# Patient Record
Sex: Female | Born: 1970 | Race: White | Hispanic: No | Marital: Married | Smoking: Former smoker
Health system: Southern US, Community
[De-identification: ages and names within clinical notes are randomized; demographics above are authoritative.]

## PROBLEM LIST (undated history)

## (undated) ENCOUNTER — Inpatient Hospital Stay (HOSPITAL_COMMUNITY): Payer: Self-pay

## (undated) DIAGNOSIS — L309 Dermatitis, unspecified: Secondary | ICD-10-CM

## (undated) HISTORY — PX: CRYOTHERAPY: SHX1416

## (undated) HISTORY — PX: DILATION AND CURETTAGE OF UTERUS: SHX78

## (undated) HISTORY — DX: Dermatitis, unspecified: L30.9

---

## 2003-11-03 ENCOUNTER — Other Ambulatory Visit: Admission: RE | Admit: 2003-11-03 | Discharge: 2003-11-03 | Payer: Self-pay | Admitting: Gynecology

## 2006-12-21 ENCOUNTER — Encounter (INDEPENDENT_AMBULATORY_CARE_PROVIDER_SITE_OTHER): Payer: Self-pay | Admitting: Obstetrics & Gynecology

## 2006-12-21 ENCOUNTER — Ambulatory Visit: Payer: Self-pay | Admitting: Obstetrics & Gynecology

## 2007-01-08 ENCOUNTER — Ambulatory Visit (HOSPITAL_COMMUNITY): Admission: RE | Admit: 2007-01-08 | Discharge: 2007-01-08 | Payer: Self-pay | Admitting: Obstetrics and Gynecology

## 2007-02-01 ENCOUNTER — Ambulatory Visit: Payer: Self-pay | Admitting: Obstetrics and Gynecology

## 2007-11-04 IMAGING — US US PELVIS COMPLETE MODIFY
1 series · 13 of 25 positions shown · non-contrast
Comparison: none

CLINICAL DATA: Evaluate for fibroids versus polyps. Menorrhagic. LMP 12/17/06. 
TRANSABDOMINAL AND TRANSVAGINAL PELVIC ULTRASOUND:
TECHNIQUE: Multiple images of the uterus and adnexa were obtained using a transabdominal and endovaginal approaches.

[Series 1: us pelvis complete modify · 0.26mm/px · 54 acquisitions, 13 frames shown]
[im 1/54]
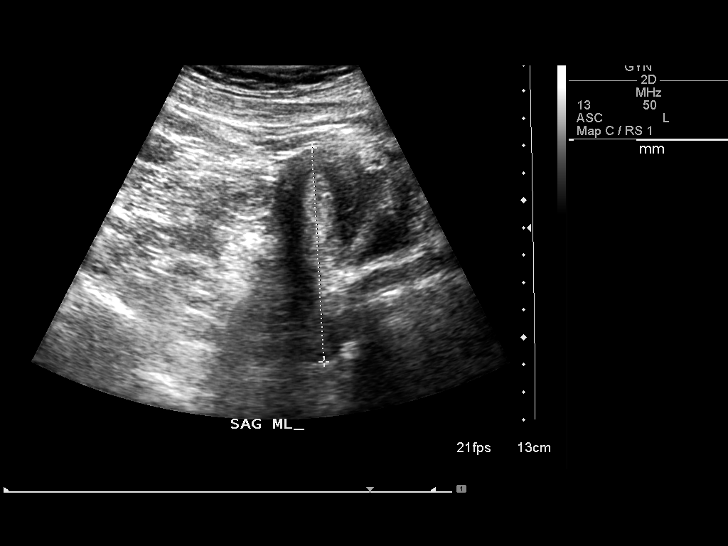
[im 5/54]
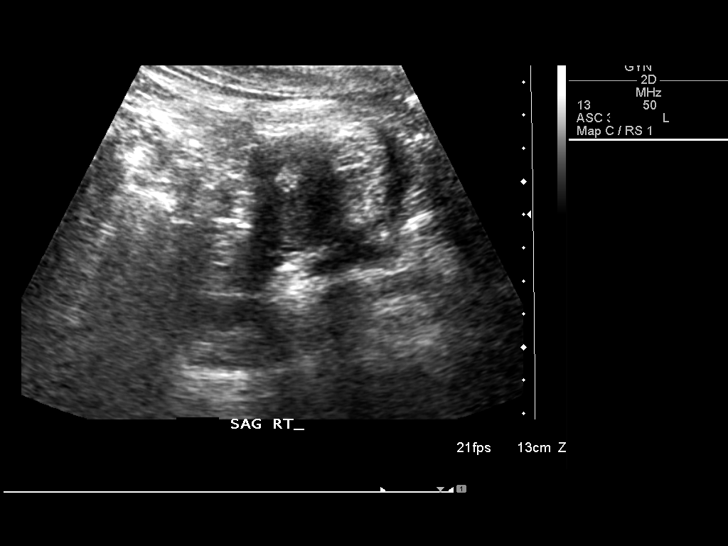
[im 9/54]
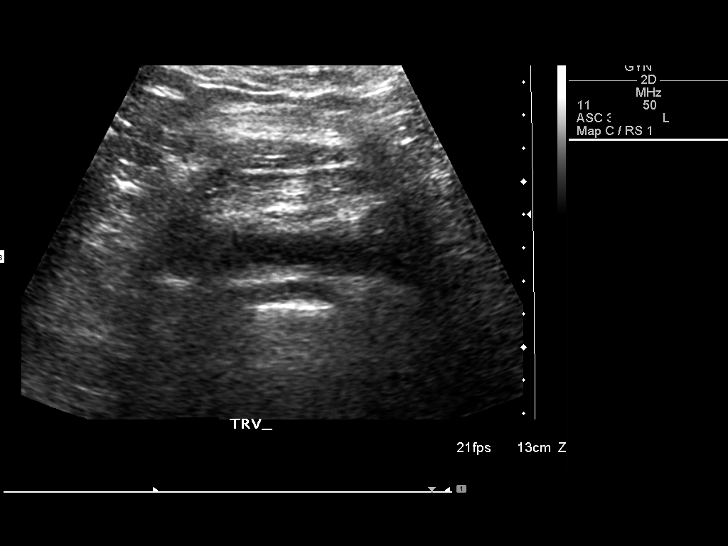
[im 14/54]
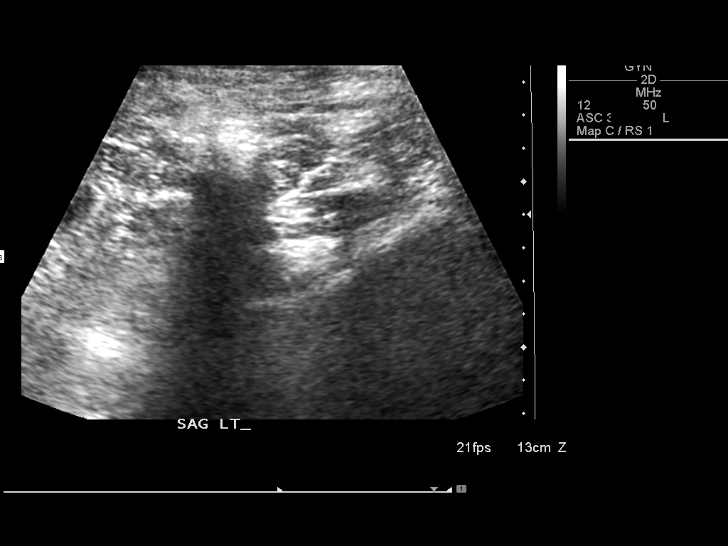
[im 18/54]
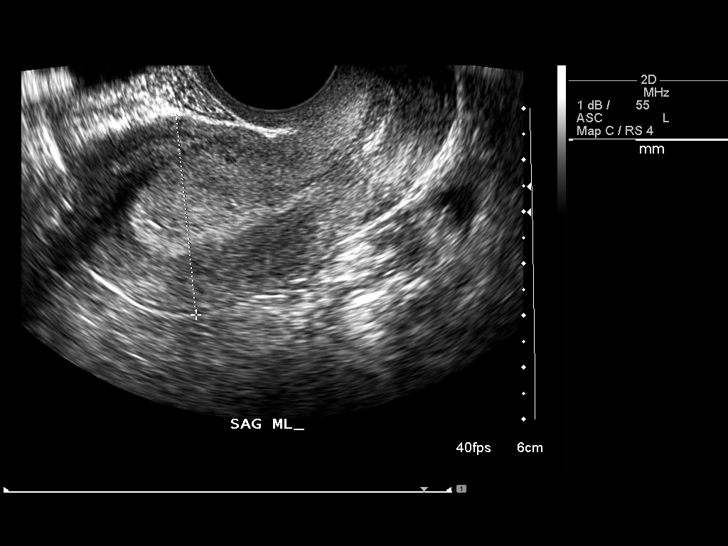
[im 23/54]
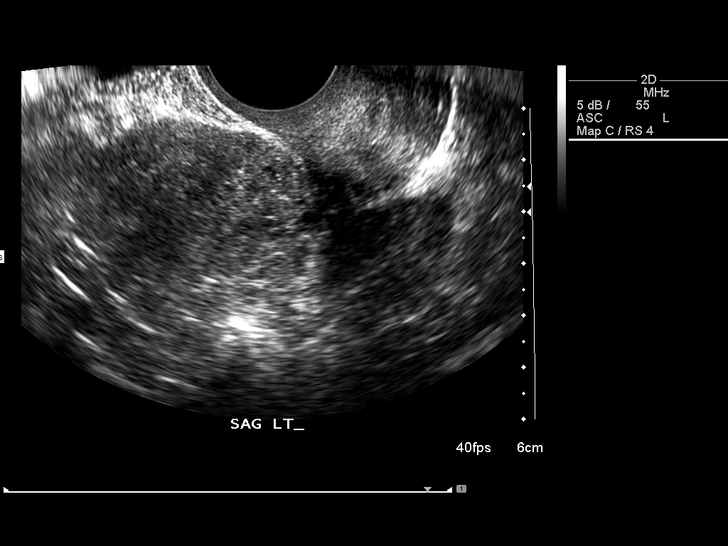
[im 27/54]
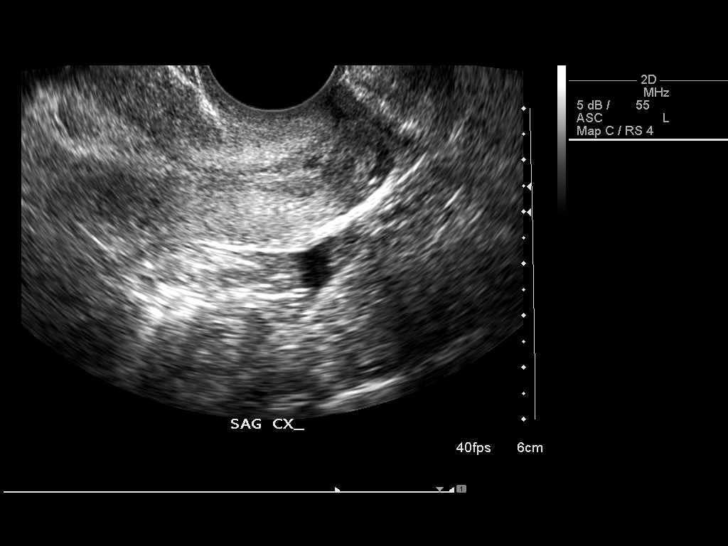
[im 31/54]
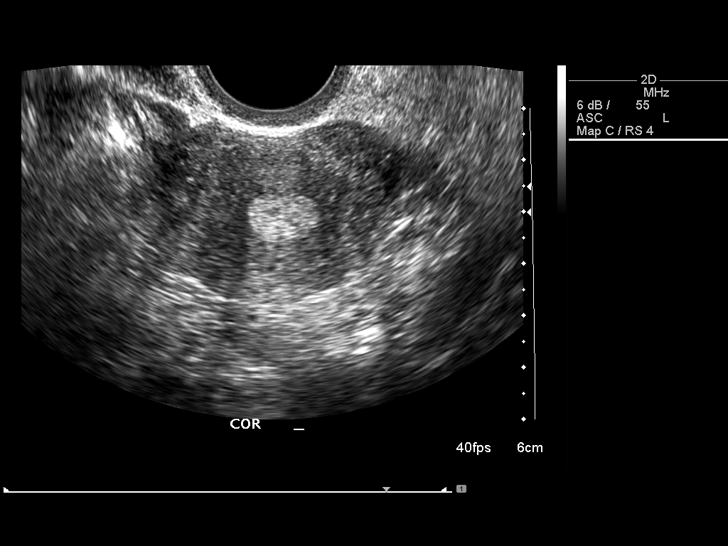
[im 36/54]
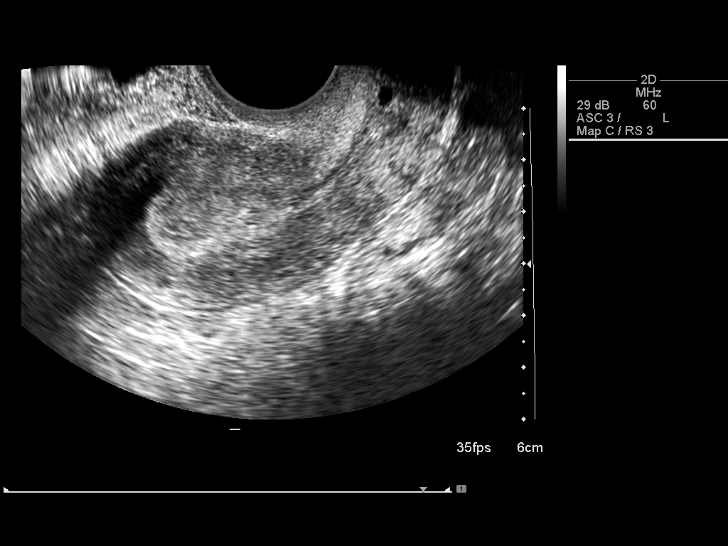
[im 40/54]
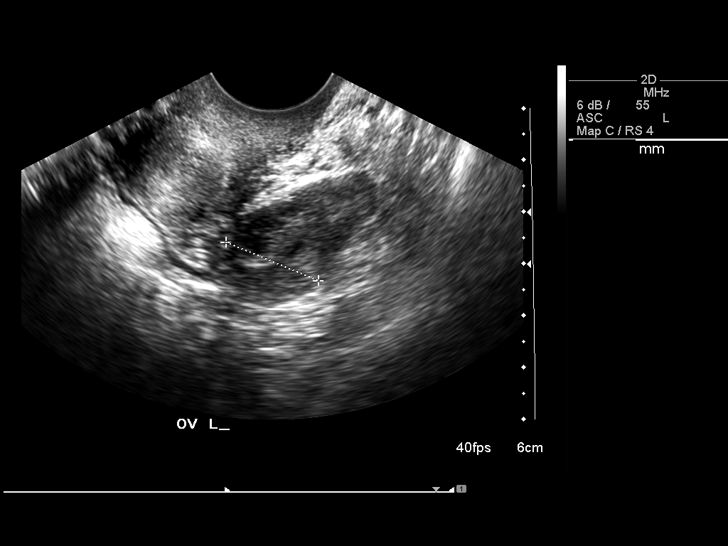
[im 45/54]
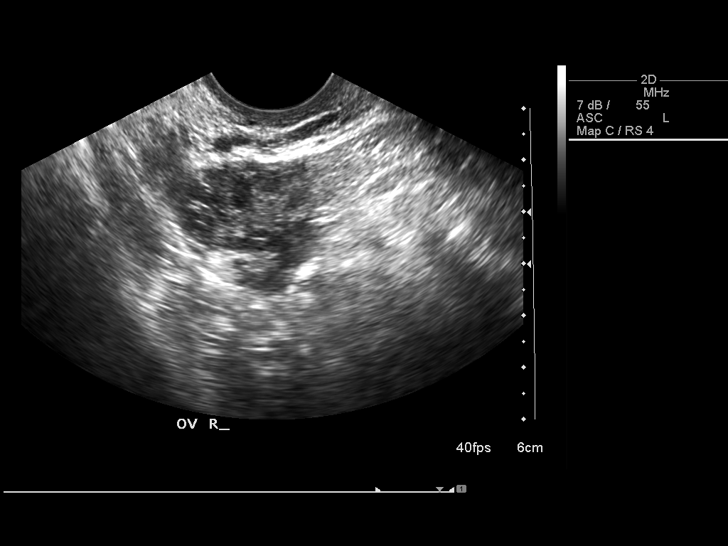
[im 49/54]
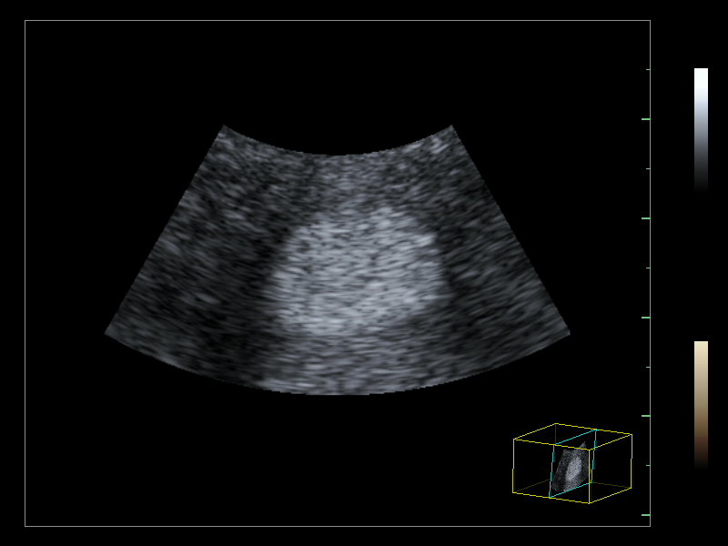
[im 54/54]
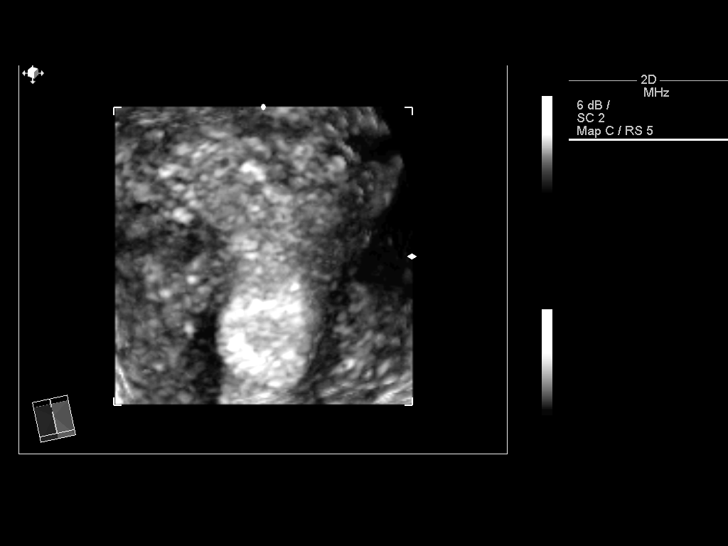

[13 of 25 positions shown; findings below may reference images not displayed]

FINDINGS: The uterus has a sagittal length of 8.1 cm, and AP width of 3.9 cm, and a transverse width of 5.0 cm.  A homogeneous uterine myometrium is seen.  The endometrial stripe appears thickened with an AP width of 1.4 cm.  There is some mild central inhomogeneity identified.  While a portion of this thickness may be related to a presecretory phase of the cycle, this remains somewhat prominent and the possibility of an underlying polyp or hyperplasia would need to be considered. Reevaluation in the immediate postsecretory phase of the cycle would be recommended and if the thickened appearance persists, sonohysterography could be undertaken at that time for more complete assessment. 
Both ovaries are seen and have a normal appearance with the right ovary measuring 2.9 x 1.7 x 1.7 cm and the left ovary measuring 3.6 x 1.8 x 1.9 cm.  No cul-de-sac or periovarian fluid is seen and no separate adnexal masses are noted.
IMPRESSION: Normal uterine myometrium and ovaries. Thickened endometrial lining, which is somewhat prominent even given the patient?s presecretory phase of the cycle. Reevaluation in the immediate postsecretory phase of the cycle with consideration for sonohysterography at that time if thickened persists is recommended.

## 2010-11-16 NOTE — Group Therapy Note (Signed)
[name], Kimberly NO.:  [id number]   MEDICAL RECORD NO.:  [id number]          PATIENT TYPE:  WOC   LOCATION:  [facility]                   FACILITY:  [facility]   PHYSICIAN:  Dorthula Perfect, MD     DATE OF BIRTH:  1971-05-11   DATE OF SERVICE:                                  CLINIC NOTE   This is a 40 year old white female gravida 3, aborted 3 (she was told  she had a weak cervix). She is self-referred today for history of  irregular periods for the past three years. Apparently, in the mid 1990s  because of what sounds like an abnormal pap smear and/or biopsy, she had  cervical cryo done. Sometime after that, she started having irregular  spottings. During that time, she had a DNC for the spotting which was  apparently normal. She was seen with the health department last month  for a pap smear. They did not evaluate her menstrual periods. She says  that she just received the result of that pap smear and was told that it  was unsatisfactory because there was too much blood in the specimen.   Her last menstrual period began June 15th and is now ending. The  previous one was May 19th, April 28th, and March 19th. Those intervals  range from 21 to 40 days. Each period lasts approximately 4-5 days. For  the last 3 years, she has been having intermenstrual spotting that is  not related to sexual intercourse.   OPERATIONS:  None, other than the cervical cryo.   MEDICATIONS:  She takes for psoriasis.   ALLERGIES:  PERFUMES and DYES.   PHYSICAL EXAMINATION:  VITAL SIGNS:  Height 5 feet 4 inches, weight 154,  blood pressure 123/78.  ABDOMEN:  Unremarkable.  PELVIC:  External genitalia and BUS glands are normal, vaginal vault  epithelized, as was the cervix. The cervical os was slightly stenotic  and scarred from her previous cryo. By manual exam, the uterus was felt  to be of normal size and shape, but it is not a satisfactory exam  because she does not relax too  well. Adnexal areas were normal.   IMPRESSION:  History of irregular menstrual cycles and intermenstrual  bleeding.   DISPOSITION:  1. Pap smear.  2. She will be scheduled for an ultrasound in about 2 1/2 weeks, and      then return two weeks later for the results. If the ultrasound does      not help with the diagnosis for the spotting, she will probably      need hysteroscopy and DNC.          ______________________________  Dorthula Perfect, MD    ER/MEDQ  D:  12/21/2006  T:  12/22/2006  Job:  [id number]

## 2014-12-23 ENCOUNTER — Ambulatory Visit (INDEPENDENT_AMBULATORY_CARE_PROVIDER_SITE_OTHER): Payer: Self-pay | Admitting: Obstetrics & Gynecology

## 2014-12-23 ENCOUNTER — Encounter: Payer: Self-pay | Admitting: Obstetrics & Gynecology

## 2014-12-23 VITALS — BP 131/89 | HR 96 | Wt 128.0 lb

## 2014-12-23 DIAGNOSIS — R5382 Chronic fatigue, unspecified: Secondary | ICD-10-CM

## 2014-12-23 DIAGNOSIS — R634 Abnormal weight loss: Secondary | ICD-10-CM

## 2014-12-23 DIAGNOSIS — IMO0002 Reserved for concepts with insufficient information to code with codable children: Secondary | ICD-10-CM

## 2014-12-23 DIAGNOSIS — R197 Diarrhea, unspecified: Secondary | ICD-10-CM

## 2014-12-23 DIAGNOSIS — R102 Pelvic and perineal pain unspecified side: Secondary | ICD-10-CM

## 2014-12-23 DIAGNOSIS — Z124 Encounter for screening for malignant neoplasm of cervix: Secondary | ICD-10-CM

## 2014-12-23 DIAGNOSIS — R112 Nausea with vomiting, unspecified: Secondary | ICD-10-CM

## 2014-12-23 DIAGNOSIS — G8929 Other chronic pain: Secondary | ICD-10-CM

## 2014-12-23 DIAGNOSIS — Z01419 Encounter for gynecological examination (general) (routine) without abnormal findings: Secondary | ICD-10-CM

## 2014-12-23 DIAGNOSIS — N949 Unspecified condition associated with female genital organs and menstrual cycle: Secondary | ICD-10-CM

## 2014-12-23 DIAGNOSIS — N941 Dyspareunia: Secondary | ICD-10-CM

## 2014-12-23 DIAGNOSIS — Z113 Encounter for screening for infections with a predominantly sexual mode of transmission: Secondary | ICD-10-CM

## 2014-12-23 DIAGNOSIS — Z1151 Encounter for screening for human papillomavirus (HPV): Secondary | ICD-10-CM

## 2014-12-23 LAB — CBC
HEMATOCRIT: 43.7 % (ref 36.0–46.0)
Hemoglobin: 15.1 g/dL — ABNORMAL HIGH (ref 12.0–15.0)
MCH: 37.2 pg — ABNORMAL HIGH (ref 26.0–34.0)
MCHC: 34.6 g/dL (ref 30.0–36.0)
MCV: 107.6 fL — AB (ref 78.0–100.0)
MPV: 10.8 fL (ref 8.6–12.4)
Platelets: 220 10*3/uL (ref 150–400)
RBC: 4.06 MIL/uL (ref 3.87–5.11)
RDW: 13.6 % (ref 11.5–15.5)
WBC: 7.7 10*3/uL (ref 4.0–10.5)

## 2014-12-23 NOTE — Patient Instructions (Signed)
Thank you for enrolling in [facility]. Please follow the instructions below to securely access your online medical record. MyChart allows you to send messages to your doctor, view your test results, manage appointments, and more.   How Do I Sign Up? 1. In your Internet browser, go to [employer] and enter [email or web address]. 2. Click on the Sign Up Now link in the Sign In box. You will see the New Member Sign Up page. 3. Enter your MyChart Access Code exactly as it appears below. You will not need to use this code after you've completed the sign-up process. If you do not sign up before the expiration date, you must request a new code.  MyChart Access Code: [email or web address] Expires: 02/21/2015 10:06 AM  4. Enter your Social Security Number ([email or web address]) and Date of Birth (mm/dd/yyyy) as indicated and click Submit. You will be taken to the next sign-up page. 5. Create a MyChart ID. This will be your MyChart login ID and cannot be changed, so think of one that is secure and easy to remember. 6. Create a MyChart password. You can change your password at any time. 7. Enter your Password Reset Question and Answer. This can be used at a later time if you forget your password.  8. Enter your e-mail address. You will receive e-mail notification when new information is available in [facility]. 9. Click Sign Up. You can now view your medical record.   Additional Information Remember, MyChart is NOT to be used for urgent needs. For medical emergencies, dial 911.   Preventive Care for Adults A healthy lifestyle and preventive care can promote health and wellness. Preventive health guidelines for women include the following key practices.  A routine yearly physical is a good way to check with your health care provider about your health and preventive screening. It is a chance to share any concerns and updates on your health and to receive a thorough exam.  Visit your dentist for a routine exam  and preventive care every 6 months. Brush your teeth twice a day and floss once a day. Good oral hygiene prevents tooth decay and gum disease.  The frequency of eye exams is based on your age, health, family medical history, use of contact lenses, and other factors. Follow your health care provider's recommendations for frequency of eye exams.  Eat a healthy diet. Foods like vegetables, fruits, whole grains, low-fat dairy products, and lean protein foods contain the nutrients you need without too many calories. Decrease your intake of foods high in solid fats, added sugars, and salt. Eat the right amount of calories for you.Get information about a proper diet from your health care provider, if necessary.  Regular physical exercise is one of the most important things you can do for your health. Most adults should get at least 150 minutes of moderate-intensity exercise (any activity that increases your heart rate and causes you to sweat) each week. In addition, most adults need muscle-strengthening exercises on 2 or more days a week.  Maintain a healthy weight. The body mass index (BMI) is a screening tool to identify possible weight problems. It provides an estimate of body fat based on height and weight. Your health care provider can find your BMI and can help you achieve or maintain a healthy weight.For adults 20 years and older:  A BMI below 18.5 is considered underweight.  A BMI of 18.5 to 24.9 is normal.  A BMI of 25 to 29.9 is considered overweight.  A BMI of  30 and above is considered obese.  Maintain normal blood lipids and cholesterol levels by exercising and minimizing your intake of saturated fat. Eat a balanced diet with plenty of fruit and vegetables. Blood tests for lipids and cholesterol should begin at age 41 and be repeated every 5 years. If your lipid or cholesterol levels are high, you are over 50, or you are at high risk for heart disease, you may need your cholesterol levels  checked more frequently.Ongoing high lipid and cholesterol levels should be treated with medicines if diet and exercise are not working.  If you smoke, find out from your health care provider how to quit. If you do not use tobacco, do not start.  Lung cancer screening is recommended for adults aged 60-80 years who are at high risk for developing lung cancer because of a history of smoking. A yearly low-dose CT scan of the lungs is recommended for people who have at least a 30-pack-year history of smoking and are a current smoker or have quit within the past 15 years. A pack year of smoking is smoking an average of 1 pack of cigarettes a day for 1 year (for example: 1 pack a day for 30 years or 2 packs a day for 15 years). Yearly screening should continue until the smoker has stopped smoking for at least 15 years. Yearly screening should be stopped for people who develop a health problem that would prevent them from having lung cancer treatment.  If you are pregnant, do not drink alcohol. If you are breastfeeding, be very cautious about drinking alcohol. If you are not pregnant and choose to drink alcohol, do not have more than 1 drink per day. One drink is considered to be 12 ounces (355 mL) of beer, 5 ounces (148 mL) of wine, or 1.5 ounces (44 mL) of liquor.  Avoid use of street drugs. Do not share needles with anyone. Ask for help if you need support or instructions about stopping the use of drugs.  High blood pressure causes heart disease and increases the risk of stroke. Your blood pressure should be checked at least every 1 to 2 years. Ongoing high blood pressure should be treated with medicines if weight loss and exercise do not work.  If you are 37-67 years old, ask your health care provider if you should take aspirin to prevent strokes.  Diabetes screening involves taking a blood sample to check your fasting blood sugar level. This should be done once every 3 years, after age 26, if you are  within normal weight and without risk factors for diabetes. Testing should be considered at a younger age or be carried out more frequently if you are overweight and have at least 1 risk factor for diabetes.  Breast cancer screening is essential preventive care for women. You should practice "breast self-awareness." This means understanding the normal appearance and feel of your breasts and may include breast self-examination. Any changes detected, no matter how small, should be reported to a health care provider. Women in their 48s and 30s should have a clinical breast exam (CBE) by a health care provider as part of a regular health exam every 1 to 3 years. After age 53, women should have a CBE every year. Starting at age 15, women should consider having a mammogram (breast X-ray test) every year. Women who have a family history of breast cancer should talk to their health care provider about genetic screening. Women at a high risk of breast cancer  should talk to their health care providers about having an MRI and a mammogram every year.  Breast cancer gene (BRCA)-related cancer risk assessment is recommended for women who have family members with BRCA-related cancers. BRCA-related cancers include breast, ovarian, tubal, and peritoneal cancers. Having family members with these cancers may be associated with an increased risk for harmful changes (mutations) in the breast cancer genes BRCA1 and BRCA2. Results of the assessment will determine the need for genetic counseling and BRCA1 and BRCA2 testing.  Routine pelvic exams to screen for cancer are no longer recommended for nonpregnant women who are considered low risk for cancer of the pelvic organs (ovaries, uterus, and vagina) and who do not have symptoms. Ask your health care provider if a screening pelvic exam is right for you.  If you have had past treatment for cervical cancer or a condition that could lead to cancer, you need Pap tests and screening  for cancer for at least 20 years after your treatment. If Pap tests have been discontinued, your risk factors (such as having a new sexual partner) need to be reassessed to determine if screening should be resumed. Some women have medical problems that increase the chance of getting cervical cancer. In these cases, your health care provider may recommend more frequent screening and Pap tests.  The HPV test is an additional test that may be used for cervical cancer screening. The HPV test looks for the virus that can cause the cell changes on the cervix. The cells collected during the Pap test can be tested for HPV. The HPV test could be used to screen women aged 71 years and older, and should be used in women of any age who have unclear Pap test results. After the age of 89, women should have HPV testing at the same frequency as a Pap test.  Colorectal cancer can be detected and often prevented. Most routine colorectal cancer screening begins at the age of 84 years and continues through age 4 years. However, your health care provider may recommend screening at an earlier age if you have risk factors for colon cancer. On a yearly basis, your health care provider may provide home test kits to check for hidden blood in the stool. Use of a small camera at the end of a tube, to directly examine the colon (sigmoidoscopy or colonoscopy), can detect the earliest forms of colorectal cancer. Talk to your health care provider about this at age 64, when routine screening begins. Direct exam of the colon should be repeated every 5-10 years through age 48 years, unless early forms of pre-cancerous polyps or small growths are found.  People who are at an increased risk for hepatitis B should be screened for this virus. You are considered at high risk for hepatitis B if:  You were born in a country where hepatitis B occurs often. Talk with your health care provider about which countries are considered high risk.  Your  parents were born in a high-risk country and you have not received a shot to protect against hepatitis B (hepatitis B vaccine).  You have HIV or AIDS.  You use needles to inject street drugs.  You live with, or have sex with, someone who has hepatitis B.  You get hemodialysis treatment.  You take certain medicines for conditions like cancer, organ transplantation, and autoimmune conditions.  Hepatitis C blood testing is recommended for all people born from [birth year] through 1965 and any individual with known risks for hepatitis C.  Practice safe sex. Use condoms and avoid high-risk sexual practices to reduce the spread of sexually transmitted infections (STIs). STIs include gonorrhea, chlamydia, syphilis, trichomonas, herpes, HPV, and human immunodeficiency virus (HIV). Herpes, HIV, and HPV are viral illnesses that have no cure. They can result in disability, cancer, and death.  You should be screened for sexually transmitted illnesses (STIs) including gonorrhea and chlamydia if:  You are sexually active and are younger than 24 years.  You are older than 24 years and your health care provider tells you that you are at risk for this type of infection.  Your sexual activity has changed since you were last screened and you are at an increased risk for chlamydia or gonorrhea. Ask your health care provider if you are at risk.  If you are at risk of being infected with HIV, it is recommended that you take a prescription medicine daily to prevent HIV infection. This is called preexposure prophylaxis (PrEP). You are considered at risk if:  You are a heterosexual woman, are sexually active, and are at increased risk for HIV infection.  You take drugs by injection.  You are sexually active with a partner who has HIV.  Talk with your health care provider about whether you are at high risk of being infected with HIV. If you choose to begin PrEP, you should first be tested for HIV. You should then be  tested every 3 months for as long as you are taking PrEP.  Osteoporosis is a disease in which the bones lose minerals and strength with aging. This can result in serious bone fractures or breaks. The risk of osteoporosis can be identified using a bone density scan. Women ages 33 years and over and women at risk for fractures or osteoporosis should discuss screening with their health care providers. Ask your health care provider whether you should take a calcium supplement or vitamin D to reduce the rate of osteoporosis.  Menopause can be associated with physical symptoms and risks. Hormone replacement therapy is available to decrease symptoms and risks. You should talk to your health care provider about whether hormone replacement therapy is right for you.  Use sunscreen. Apply sunscreen liberally and repeatedly throughout the day. You should seek shade when your shadow is shorter than you. Protect yourself by wearing long sleeves, pants, a wide-brimmed hat, and sunglasses year round, whenever you are outdoors.  Once a month, do a whole body skin exam, using a mirror to look at the skin on your back. Tell your health care provider of new moles, moles that have irregular borders, moles that are larger than a pencil eraser, or moles that have changed in shape or color.  Stay current with required vaccines (immunizations).  Influenza vaccine. All adults should be immunized every year.  Tetanus, diphtheria, and acellular pertussis (Td, Tdap) vaccine. Pregnant women should receive 1 dose of Tdap vaccine during each pregnancy. The dose should be obtained regardless of the length of time since the last dose. Immunization is preferred during the 27th-36th week of gestation. An adult who has not previously received Tdap or who does not know her vaccine status should receive 1 dose of Tdap. This initial dose should be followed by tetanus and diphtheria toxoids (Td) booster doses every 10 years. Adults with an  unknown or incomplete history of completing a 3-dose immunization series with Td-containing vaccines should begin or complete a primary immunization series including a Tdap dose. Adults should receive a Td booster every 10 years.  Varicella vaccine. An adult without evidence of immunity to varicella should receive 2 doses or a second dose if she has previously received 1 dose. Pregnant females who do not have evidence of immunity should receive the first dose after pregnancy. This first dose should be obtained before leaving the health care facility. The second dose should be obtained 4-8 weeks after the first dose.  Human papillomavirus (HPV) vaccine. Females aged 13-26 years who have not received the vaccine previously should obtain the 3-dose series. The vaccine is not recommended for use in pregnant females. However, pregnancy testing is not needed before receiving a dose. If a female is found to be pregnant after receiving a dose, no treatment is needed. In that case, the remaining doses should be delayed until after the pregnancy. Immunization is recommended for any person with an immunocompromised condition through the age of 64 years if she did not get any or all doses earlier. During the 3-dose series, the second dose should be obtained 4-8 weeks after the first dose. The third dose should be obtained 24 weeks after the first dose and 16 weeks after the second dose.  Zoster vaccine. One dose is recommended for adults aged 60 years or older unless certain conditions are present.  Measles, mumps, and rubella (MMR) vaccine. Adults born before [birth year] generally are considered immune to measles and mumps. Adults born in [birth year] or later should have 1 or more doses of MMR vaccine unless there is a contraindication to the vaccine or there is laboratory evidence of immunity to each of the three diseases. A routine second dose of MMR vaccine should be obtained at least 28 days after the first dose for students  attending postsecondary schools, health care workers, or international travelers. People who received inactivated measles vaccine or an unknown type of measles vaccine during 1963-1967 should receive 2 doses of MMR vaccine. People who received inactivated mumps vaccine or an unknown type of mumps vaccine before 1979 and are at high risk for mumps infection should consider immunization with 2 doses of MMR vaccine. For females of childbearing age, rubella immunity should be determined. If there is no evidence of immunity, females who are not pregnant should be vaccinated. If there is no evidence of immunity, females who are pregnant should delay immunization until after pregnancy. Unvaccinated health care workers born before [birth year] who lack laboratory evidence of measles, mumps, or rubella immunity or laboratory confirmation of disease should consider measles and mumps immunization with 2 doses of MMR vaccine or rubella immunization with 1 dose of MMR vaccine.  Pneumococcal 13-valent conjugate (PCV13) vaccine. When indicated, a person who is uncertain of her immunization history and has no record of immunization should receive the PCV13 vaccine. An adult aged 58 years or older who has certain medical conditions and has not been previously immunized should receive 1 dose of PCV13 vaccine. This PCV13 should be followed with a dose of pneumococcal polysaccharide (PPSV23) vaccine. The PPSV23 vaccine dose should be obtained at least 8 weeks after the dose of PCV13 vaccine. An adult aged 32 years or older who has certain medical conditions and previously received 1 or more doses of PPSV23 vaccine should receive 1 dose of PCV13. The PCV13 vaccine dose should be obtained 1 or more years after the last PPSV23 vaccine dose.  Pneumococcal polysaccharide (PPSV23) vaccine. When PCV13 is also indicated, PCV13 should be obtained first. All adults aged 58 years and older should be immunized. An adult younger than age 44 years who  has certain medical conditions should be immunized. Any person who resides in a nursing home or long-term care facility should be immunized. An adult smoker should be immunized. People with an immunocompromised condition and certain other conditions should receive both PCV13 and PPSV23 vaccines. People with human immunodeficiency virus (HIV) infection should be immunized as soon as possible after diagnosis. Immunization during chemotherapy or radiation therapy should be avoided. Routine use of PPSV23 vaccine is not recommended for American Indians, [place] Natives, or people younger than 65 years unless there are medical conditions that require PPSV23 vaccine. When indicated, people who have unknown immunization and have no record of immunization should receive PPSV23 vaccine. One-time revaccination 5 years after the first dose of PPSV23 is recommended for people aged 19-64 years who have chronic kidney failure, nephrotic syndrome, asplenia, or immunocompromised conditions. People who received 1-2 doses of PPSV23 before age 48 years should receive another dose of PPSV23 vaccine at age 32 years or later if at least 5 years have passed since the previous dose. Doses of PPSV23 are not needed for people immunized with PPSV23 at or after age 50 years.  Meningococcal vaccine. Adults with asplenia or persistent complement component deficiencies should receive 2 doses of quadrivalent meningococcal conjugate (MenACWY-D) vaccine. The doses should be obtained at least 2 months apart. Microbiologists working with certain meningococcal bacteria, [employer] recruits, people at risk during an outbreak, and people who travel to or live in countries with a high rate of meningitis should be immunized. A first-year college student up through age 56 years who is living in a residence hall should receive a dose if she did not receive a dose on or after her 16th birthday. Adults who have certain high-risk conditions should receive one or  more doses of vaccine.  Hepatitis A vaccine. Adults who wish to be protected from this disease, have certain high-risk conditions, work with hepatitis A-infected animals, work in hepatitis A research labs, or travel to or work in countries with a high rate of hepatitis A should be immunized. Adults who were previously unvaccinated and who anticipate close contact with an international adoptee during the first 60 days after arrival in the [place] from a country with a high rate of hepatitis A should be immunized.  Hepatitis B vaccine. Adults who wish to be protected from this disease, have certain high-risk conditions, may be exposed to blood or other infectious body fluids, are household contacts or sex partners of hepatitis B positive people, are clients or workers in certain care facilities, or travel to or work in countries with a high rate of hepatitis B should be immunized.  Haemophilus influenzae type b (Hib) vaccine. A previously unvaccinated person with asplenia or sickle cell disease or having a scheduled splenectomy should receive 1 dose of Hib vaccine. Regardless of previous immunization, a recipient of a hematopoietic stem cell transplant should receive a 3-dose series 6-12 months after her successful transplant. Hib vaccine is not recommended for adults with HIV infection. Preventive Services / Frequency Ages 103 to 62 years  Blood pressure check.** / Every 1 to 2 years.  Lipid and cholesterol check.** / Every 5 years beginning at age 26.  Clinical breast exam.** / Every 3 years for women in their 72s and 60s.  BRCA-related cancer risk assessment.** / For women who have family members with a BRCA-related cancer (breast, ovarian, tubal, or peritoneal cancers).  Pap test.** / Every 2 years from ages 28 through 73. Every 3 years starting at age  49 through age 32 or 96 with a history of 3 consecutive normal Pap tests.  HPV screening.** / Every 3 years from ages 20 through ages 48 to  67 with a history of 3 consecutive normal Pap tests.  Hepatitis C blood test.** / For any individual with known risks for hepatitis C.  Skin self-exam. / Monthly.  Influenza vaccine. / Every year.  Tetanus, diphtheria, and acellular pertussis (Tdap, Td) vaccine.** / Consult your health care provider. Pregnant women should receive 1 dose of Tdap vaccine during each pregnancy. 1 dose of Td every 10 years.  Varicella vaccine.** / Consult your health care provider. Pregnant females who do not have evidence of immunity should receive the first dose after pregnancy.  HPV vaccine. / 3 doses over 6 months, if 23 and younger. The vaccine is not recommended for use in pregnant females. However, pregnancy testing is not needed before receiving a dose.  Measles, mumps, rubella (MMR) vaccine.** / You need at least 1 dose of MMR if you were born in 1957 or later. You may also need a 2nd dose. For females of childbearing age, rubella immunity should be determined. If there is no evidence of immunity, females who are not pregnant should be vaccinated. If there is no evidence of immunity, females who are pregnant should delay immunization until after pregnancy.  Pneumococcal 13-valent conjugate (PCV13) vaccine.** / Consult your health care provider.  Pneumococcal polysaccharide (PPSV23) vaccine.** / 1 to 2 doses if you smoke cigarettes or if you have certain conditions.  Meningococcal vaccine.** / 1 dose if you are age 21 to 24 years and a Market researcher living in a residence hall, or have one of several medical conditions, you need to get vaccinated against meningococcal disease. You may also need additional booster doses.  Hepatitis A vaccine.** / Consult your health care provider.  Hepatitis B vaccine.** / Consult your health care provider.  Haemophilus influenzae type b (Hib) vaccine.** / Consult your health care provider. Ages 16 to 71 years  Blood pressure check.** / Every 1 to 2  years.  Lipid and cholesterol check.** / Every 5 years beginning at age 38 years.  Lung cancer screening. / Every year if you are aged 6-80 years and have a 30-pack-year history of smoking and currently smoke or have quit within the past 15 years. Yearly screening is stopped once you have quit smoking for at least 15 years or develop a health problem that would prevent you from having lung cancer treatment.  Clinical breast exam.** / Every year after age 19 years.  BRCA-related cancer risk assessment.** / For women who have family members with a BRCA-related cancer (breast, ovarian, tubal, or peritoneal cancers).  Mammogram.** / Every year beginning at age 73 years and continuing for as long as you are in good health. Consult with your health care provider.  Pap test.** / Every 3 years starting at age 25 years through age 47 or 57 years with a history of 3 consecutive normal Pap tests.  HPV screening.** / Every 3 years from ages 82 years through ages 41 to 73 years with a history of 3 consecutive normal Pap tests.  Fecal occult blood test (FOBT) of stool. / Every year beginning at age 43 years and continuing until age 36 years. You may not need to do this test if you get a colonoscopy every 10 years.  Flexible sigmoidoscopy or colonoscopy.** / Every 5 years for a flexible sigmoidoscopy or every 10 years for a  colonoscopy beginning at age 21 years and continuing until age 84 years.  Hepatitis C blood test.** / For all people born from [birth year] through 1965 and any individual with known risks for hepatitis C.  Skin self-exam. / Monthly.  Influenza vaccine. / Every year.  Tetanus, diphtheria, and acellular pertussis (Tdap/Td) vaccine.** / Consult your health care provider. Pregnant women should receive 1 dose of Tdap vaccine during each pregnancy. 1 dose of Td every 10 years.  Varicella vaccine.** / Consult your health care provider. Pregnant females who do not have evidence of immunity should  receive the first dose after pregnancy.  Zoster vaccine.** / 1 dose for adults aged 18 years or older.  Measles, mumps, rubella (MMR) vaccine.** / You need at least 1 dose of MMR if you were born in 1957 or later. You may also need a 2nd dose. For females of childbearing age, rubella immunity should be determined. If there is no evidence of immunity, females who are not pregnant should be vaccinated. If there is no evidence of immunity, females who are pregnant should delay immunization until after pregnancy.  Pneumococcal 13-valent conjugate (PCV13) vaccine.** / Consult your health care provider.  Pneumococcal polysaccharide (PPSV23) vaccine.** / 1 to 2 doses if you smoke cigarettes or if you have certain conditions.  Meningococcal vaccine.** / Consult your health care provider.  Hepatitis A vaccine.** / Consult your health care provider.  Hepatitis B vaccine.** / Consult your health care provider.  Haemophilus influenzae type b (Hib) vaccine.** / Consult your health care provider. Ages 26 years and over  Blood pressure check.** / Every 1 to 2 years.  Lipid and cholesterol check.** / Every 5 years beginning at age 28 years.  Lung cancer screening. / Every year if you are aged 29-80 years and have a 30-pack-year history of smoking and currently smoke or have quit within the past 15 years. Yearly screening is stopped once you have quit smoking for at least 15 years or develop a health problem that would prevent you from having lung cancer treatment.  Clinical breast exam.** / Every year after age 67 years.  BRCA-related cancer risk assessment.** / For women who have family members with a BRCA-related cancer (breast, ovarian, tubal, or peritoneal cancers).  Mammogram.** / Every year beginning at age 41 years and continuing for as long as you are in good health. Consult with your health care provider.  Pap test.** / Every 3 years starting at age 40 years through age 35 or 29 years with 3  consecutive normal Pap tests. Testing can be stopped between 65 and 70 years with 3 consecutive normal Pap tests and no abnormal Pap or HPV tests in the past 10 years.  HPV screening.** / Every 3 years from ages 55 years through ages 110 or 28 years with a history of 3 consecutive normal Pap tests. Testing can be stopped between 65 and 70 years with 3 consecutive normal Pap tests and no abnormal Pap or HPV tests in the past 10 years.  Fecal occult blood test (FOBT) of stool. / Every year beginning at age 70 years and continuing until age 70 years. You may not need to do this test if you get a colonoscopy every 10 years.  Flexible sigmoidoscopy or colonoscopy.** / Every 5 years for a flexible sigmoidoscopy or every 10 years for a colonoscopy beginning at age 35 years and continuing until age 44 years.  Hepatitis C blood test.** / For all people born from [birth year] through 1965  and any individual with known risks for hepatitis C.  Osteoporosis screening.** / A one-time screening for women ages 68 years and over and women at risk for fractures or osteoporosis.  Skin self-exam. / Monthly.  Influenza vaccine. / Every year.  Tetanus, diphtheria, and acellular pertussis (Tdap/Td) vaccine.** / 1 dose of Td every 10 years.  Varicella vaccine.** / Consult your health care provider.  Zoster vaccine.** / 1 dose for adults aged 60 years or older.  Pneumococcal 13-valent conjugate (PCV13) vaccine.** / Consult your health care provider.  Pneumococcal polysaccharide (PPSV23) vaccine.** / 1 dose for all adults aged 7 years and older.  Meningococcal vaccine.** / Consult your health care provider.  Hepatitis A vaccine.** / Consult your health care provider.  Hepatitis B vaccine.** / Consult your health care provider.  Haemophilus influenzae type b (Hib) vaccine.** / Consult your health care provider. ** Family history and personal history of risk and conditions may change your health care provider's  recommendations. Document Released: 08/16/2001 Document Revised: 11/04/2013 Document Reviewed: 11/15/2010 [facility] Patient Information 2015 [place], [place]. This information is not intended to replace advice given to you by your health care provider. Make sure you discuss any questions you have with your health care provider.

## 2014-12-23 NOTE — Progress Notes (Signed)
GYNECOLOGY CLINIC ANNUAL PREVENTATIVE CARE ENCOUNTER NOTE  Subjective:   Kimberly [name] is a 44 y.o. G82P0040 female here for a routine annual gynecologic exam.  Current complaints: pelvic pain and dyspareunia for past 6 months.  Does not feel adequately lubricated; water based lubricant does not work, primrose oil works better for her.  Pelvic pain is diffuse in lower abdomen, not ameliorated by any factor but worsened by intercourse.  Patient also reports weight loss of 50 lbs in last 12 months that was unintentional, also has nausea, vomiting, diarrhea, anorexia for past 3 months.   Denies abnormal vaginal bleeding, discharge or other gynecologic concerns.    Gynecologic History Patient's last menstrual period was 12/20/2014. Contraception: none Last Pap: over 10 years ago. Results were: normal Last mammogram: never had one, planning to start at age 29.  Obstetric History OB History  Gravida Para Term Preterm AB SAB TAB Ectopic Multiple Living  4    4 4     0    # Outcome Date GA Lbr Len/2nd Weight Sex Delivery Anes PTL Lv  4 SAB           3 SAB           2 SAB           1 SAB               Past Medical History  Diagnosis Date  . Eczema     Past Surgical History  Procedure Laterality Date  . Dilation and curettage of uterus    . Cryotherapy      No current outpatient prescriptions on file prior to visit.   No current facility-administered medications on file prior to visit.    No Known Allergies  History   Social History  . Marital Status: Single    Spouse Name: N/A  . Number of Children: N/A  . Years of Education: N/A   Occupational History  . Not on file.   Social History Main Topics  . Smoking status: Current Every Day Smoker  . Smokeless tobacco: Never Used  . Alcohol Use: No  . Drug Use: No  . Sexual Activity:    Partners: Male    Birth Control/ Protection: None   Other Topics Concern  . Not on file   Social History Narrative  . No narrative  on file    History reviewed. No pertinent family history.  The following portions of the patient's history were reviewed and updated as appropriate: allergies, current medications, past family history, past medical history, past social history, past surgical history and problem list.  Review of Systems Pertinent items are noted in HPI.   Objective:   BP 131/89 mmHg  Pulse 96  Wt 128 lb (58.06 kg)  LMP 12/20/2014 CONSTITUTIONAL: Well-developed, well-nourished female in no acute distress.  HENT:  Normocephalic, atraumatic, External right and left ear normal. Oropharynx is clear and moist EYES: Conjunctivae and EOM are normal. Pupils are equal, round, and reactive to light. No scleral icterus.  NECK: Normal range of motion, supple, no masses.  Normal thyroid.  SKIN: Skin is warm and dry. No rash noted. Not diaphoretic. No erythema. No pallor. [place]: Alert and oriented to person, place, and time. Normal reflexes, muscle tone coordination. No cranial nerve deficit noted. PSYCHIATRIC: Normal mood and affect. Normal behavior. Normal judgment and thought content. CARDIOVASCULAR: Normal heart rate noted, regular rhythm RESPIRATORY: Clear to auscultation bilaterally. Effort and breath sounds normal, no problems with respiration  noted. BREASTS: Symmetric in size. No masses, skin changes, nipple drainage, or lymphadenopathy. ABDOMEN: Soft, normal bowel sounds, no distention noted.  No tenderness, rebound or guarding.  PELVIC: Normal appearing external genitalia; normal appearing vaginal mucosa and cervix.  No abnormal discharge noted.  Pap smear obtained.  Normal uterine size, no other palpable masses, diffuse uterine and adnexal tenderness during examination. MUSCULOSKELETAL: Normal range of motion. No tenderness.  No cyanosis, clubbing, or edema.  2+ distal pulses.   Assessment:   Annual gynecologic examination with pap smear Pelvic pain and dyspareunia Multiple systemic complaints     Plan:  Will follow up results of pap smear and ancillary testing and manage accordingly. Pelvic ultrasound scheduled; will follow up results and manage accordingly. Continue to use lubrication as needed; recommended trail of silicone-base lubrication Will refer to [facility] for evaluation of other issues Routine preventative health maintenance measures emphasized. Please refer to After Visit Summary for other counseling recommendations.   Verita Schneiders, MD, [place] Attending [place] for [employer], [place]

## 2014-12-24 LAB — TSH: TSH: 2.099 u[IU]/mL (ref 0.350–4.500)

## 2014-12-24 LAB — HEMOGLOBIN A1C
HEMOGLOBIN A1C: 5.7 % — AB (ref <5.7)
MEAN PLASMA GLUCOSE: 117 mg/dL — AB (ref <117)

## 2014-12-25 LAB — CERVICOVAGINAL ANCILLARY ONLY
BACTERIAL VAGINITIS: NEGATIVE
CANDIDA VAGINITIS: NEGATIVE

## 2014-12-25 LAB — CYTOLOGY - PAP

## 2015-01-08 ENCOUNTER — Ambulatory Visit (HOSPITAL_COMMUNITY)
Admission: RE | Admit: 2015-01-08 | Discharge: 2015-01-08 | Disposition: A | Discharge status: Home / Self Care | Payer: Self-pay | Source: Ambulatory Visit | Attending: Obstetrics & Gynecology | Admitting: Obstetrics & Gynecology

## 2015-01-08 DIAGNOSIS — G8929 Other chronic pain: Secondary | ICD-10-CM | POA: Insufficient documentation

## 2015-01-08 DIAGNOSIS — D252 Subserosal leiomyoma of uterus: Secondary | ICD-10-CM | POA: Insufficient documentation

## 2015-01-08 DIAGNOSIS — R102 Pelvic and perineal pain: Secondary | ICD-10-CM | POA: Insufficient documentation

## 2015-01-08 DIAGNOSIS — N8320 Unspecified ovarian cysts: Secondary | ICD-10-CM | POA: Insufficient documentation

## 2015-01-08 DIAGNOSIS — D251 Intramural leiomyoma of uterus: Secondary | ICD-10-CM | POA: Insufficient documentation

## 2015-01-08 DIAGNOSIS — N941 Dyspareunia: Secondary | ICD-10-CM | POA: Insufficient documentation

## 2015-01-08 DIAGNOSIS — IMO0002 Reserved for concepts with insufficient information to code with codable children: Secondary | ICD-10-CM

## 2015-01-12 ENCOUNTER — Telehealth: Payer: Self-pay | Admitting: *Deleted

## 2015-01-12 NOTE — Telephone Encounter (Signed)
Called pt to adv [place] results - pt was sure about wanting a hysterectomy. Made appt for pt to have surgery consult with Dr Harolyn Rutherford.

## 2015-01-22 ENCOUNTER — Encounter: Payer: Self-pay | Admitting: Obstetrics & Gynecology

## 2015-01-22 ENCOUNTER — Ambulatory Visit (INDEPENDENT_AMBULATORY_CARE_PROVIDER_SITE_OTHER): Payer: Self-pay | Admitting: Obstetrics & Gynecology

## 2015-01-22 VITALS — BP 132/84 | HR 74 | Ht 64.0 in | Wt 130.0 lb

## 2015-01-22 DIAGNOSIS — N939 Abnormal uterine and vaginal bleeding, unspecified: Secondary | ICD-10-CM

## 2015-01-22 MED ORDER — MEGESTROL ACETATE 20 MG PO TABS: 40.0000 mg | ORAL_TABLET | Freq: Every day | ORAL | Status: DC

## 2015-01-22 NOTE — Progress Notes (Signed)
Here today for surgical consult.  Has bleed continuous since June but does feel actual period started yesterday.  She has cramping with her periods, but bleeds in between.

## 2015-01-22 NOTE — Progress Notes (Signed)
CLINIC ENCOUNTER NOTE  History:  44 y.o. G4P0040 here today for discussion of surgery for AUB and fibroids. She was seen on 12/23/14 for annual exam, at that time she reported pelvic pain and regular menses.  Now reports having bleeding daily since last visit, similar episode happened eight years ago and stopped after a couple of months.  Wears one large pad daily; no symptoms of anemia. Wants hysterectomy for the AUB; "I am tried of this!". She denies any other concerns.   Past Medical History  Diagnosis Date  . Eczema     Past Surgical History  Procedure Laterality Date  . Dilation and curettage of uterus    . Cryotherapy      The following portions of the patient's history were reviewed and updated as appropriate: allergies, current medications, past family history, past medical history, past social history, past surgical history and problem list.   Health Maintenance:  Normal pap and negative HRHPV on 12/23/2014 .   Review of Systems:  Pertinent items are noted in HPI. Comprehensive review of systems was otherwise negative.  Objective:  Physical Exam BP 132/84 mmHg  Pulse 74  Ht 5\' 4"  (1.626 m)  Wt 130 lb (58.968 kg)  BMI 22.30 kg/m2  LMP 01/21/2015 CONSTITUTIONAL: Well-developed, well-nourished female in no acute distress.  HENT:  Normocephalic, atraumatic. External right and left ear normal. Oropharynx is clear and moist EYES: Conjunctivae and EOM are normal. Pupils are equal, round, and reactive to light. No scleral icterus.  NECK: Normal range of motion, supple, no masses SKIN: Skin is warm and dry. No rash noted. Not diaphoretic. No erythema. No pallor. [place]: Alert and oriented to person, place, and time. Normal reflexes, muscle tone coordination. No cranial nerve deficit noted. PSYCHIATRIC: Normal mood and affect. Normal behavior. Normal judgment and thought content. CARDIOVASCULAR: Normal heart rate noted RESPIRATORY: Effort and breath sounds normal, no  problems with respiration noted ABDOMEN: Soft, no distention noted.   PELVIC: Normal appearing external genitalia; normal appearing vaginal mucosa and cervix.  Small amount of bright, red blood in vault, no active bleeding.  MUSCULOSKELETAL: Normal range of motion. No edema noted.  Labs and Imaging US Transvaginal Non-ob  01/08/2015   CLINICAL DATA:  Patient with chronic pelvic pain and dyspareunia.  EXAM: TRANSABDOMINAL AND TRANSVAGINAL ULTRASOUND OF PELVIS  TECHNIQUE: Both transabdominal and transvaginal ultrasound examinations of the pelvis were performed. Transabdominal technique was performed for global imaging of the pelvis including uterus, ovaries, adnexal regions, and pelvic cul-de-sac. It was necessary to proceed with endovaginal exam following the transabdominal exam to visualize the endometrium and adnexal structures.  COMPARISON:  Pelvic ultrasound 01/08/2007  FINDINGS: Uterus  Measurements: 8.3 x 4.9 x 5.2 cm. Multiple uterine fibroids are demonstrated. There is a 1.8 x 2.6 x 1.8 cm subserosal fibroid along the anterior uterine fundus. There is a 2.4 x 1.8 x 2.2 cm intramural fibroid within the posterior uterine body. There is a 2.9 x 2.6 x 2.7 cm intramural fibroid within the posterior uterine body.  Endometrium  Thickness: 4 mm.  No focal abnormality visualized.  Right ovary  Measurements: 3.2 x 2.7 x 3.0 cm. There is a 2.7 cm simple cyst within the right ovary.  Left ovary  Measurements: 2.0 x 1.1 x 1.5 cm. Normal appearance/no adnexal mass.  Other findings  Trace free fluid in the pelvis.  IMPRESSION: Fibroid uterus.  Trace free fluid in the pelvis.   Electronically Signed   By: Polly Cobia.D.  On: 01/08/2015 16:29   US Pelvis Complete  01/08/2015   CLINICAL DATA:  Patient with chronic pelvic pain and dyspareunia.  EXAM: TRANSABDOMINAL AND TRANSVAGINAL ULTRASOUND OF PELVIS  TECHNIQUE: Both transabdominal and transvaginal ultrasound examinations of the pelvis were performed.  Transabdominal technique was performed for global imaging of the pelvis including uterus, ovaries, adnexal regions, and pelvic cul-de-sac. It was necessary to proceed with endovaginal exam following the transabdominal exam to visualize the endometrium and adnexal structures.  COMPARISON:  Pelvic ultrasound 01/08/2007  FINDINGS: Uterus  Measurements: 8.3 x 4.9 x 5.2 cm. Multiple uterine fibroids are demonstrated. There is a 1.8 x 2.6 x 1.8 cm subserosal fibroid along the anterior uterine fundus. There is a 2.4 x 1.8 x 2.2 cm intramural fibroid within the posterior uterine body. There is a 2.9 x 2.6 x 2.7 cm intramural fibroid within the posterior uterine body.  Endometrium  Thickness: 4 mm.  No focal abnormality visualized.  Right ovary  Measurements: 3.2 x 2.7 x 3.0 cm. There is a 2.7 cm simple cyst within the right ovary.  Left ovary  Measurements: 2.0 x 1.1 x 1.5 cm. Normal appearance/no adnexal mass.  Other findings  Trace free fluid in the pelvis.  IMPRESSION: Fibroid uterus.  Trace free fluid in the pelvis.   Electronically Signed   By: Lovey Newcomer M.D.   On: 01/08/2015 16:29    Assessment & Plan:  Pelvic ultrasound results reviewed with patient.  Discussed management options for abnormal uterine bleeding including tranexamic acid (Lysteda), oral progesterone, Depo Provera, Mirena IUD, endometrial ablation (Novasure/Hydrothermal Ablation) or hysterectomy as definitive surgical management.  Discussed risks and benefits of each method emphasizing that the first line of therapy was medical therapy.   Patient desires Megace for now, this was prescribed.  Bleeding precautions reviewed.  Will follow up in 3 months. Routine preventative health maintenance measures emphasized. Please refer to After Visit Summary for other counseling recommendations.    Verita Schneiders, MD, [place] Attending [place] for [employer], [place]

## 2015-02-20 ENCOUNTER — Ambulatory Visit (INDEPENDENT_AMBULATORY_CARE_PROVIDER_SITE_OTHER): Payer: Self-pay | Admitting: Obstetrics & Gynecology

## 2015-02-20 ENCOUNTER — Encounter: Payer: Self-pay | Admitting: Obstetrics & Gynecology

## 2015-02-20 VITALS — BP 150/89 | HR 67 | Resp 16 | Ht 64.0 in | Wt 134.0 lb

## 2015-02-20 DIAGNOSIS — Z01818 Encounter for other preprocedural examination: Secondary | ICD-10-CM

## 2015-02-20 DIAGNOSIS — N939 Abnormal uterine and vaginal bleeding, unspecified: Secondary | ICD-10-CM

## 2015-02-20 NOTE — Patient Instructions (Signed)
Hysterectomy Information  A hysterectomy is a surgery in which your uterus is removed. This surgery may be done to treat various medical problems. After the surgery, you will no longer have menstrual periods. The surgery will also make you unable to become pregnant (sterile). The fallopian tubes and ovaries can be removed (bilateral salpingo-oophorectomy) during this surgery as well.  REASONS FOR A HYSTERECTOMY  Persistent, abnormal bleeding.  Lasting (chronic) pelvic pain or infection.  The lining of the uterus (endometrium) starts growing outside the uterus (endometriosis).  The endometrium starts growing in the muscle of the uterus (adenomyosis).  The uterus falls down into the vagina (pelvic organ prolapse).  Noncancerous growths in the uterus (uterine fibroids) that cause symptoms.  Precancerous cells.  Cervical cancer or uterine cancer. TYPES OF HYSTERECTOMIES  Supracervical hysterectomy--In this type, the top part of the uterus is removed, but not the cervix.  Total hysterectomy--The uterus and cervix are removed.  Radical hysterectomy--The uterus, the cervix, and the fibrous tissue that holds the uterus in place in the pelvis (parametrium) are removed. WAYS A HYSTERECTOMY CAN BE PERFORMED  Abdominal hysterectomy--A large surgical cut (incision) is made in the abdomen. The uterus is removed through this incision.  Vaginal hysterectomy--An incision is made in the vagina. The uterus is removed through this incision. There are no abdominal incisions.  Conventional laparoscopic hysterectomy--Three or four small incisions are made in the abdomen. A thin, lighted tube with a camera (laparoscope) is inserted into one of the incisions. Other tools are put through the other incisions. The uterus is cut into small pieces. The small pieces are removed through the incisions, or they are removed through the vagina.  Laparoscopically assisted vaginal hysterectomy (LAVH)--Three or four  small incisions are made in the abdomen. Part of the surgery is performed laparoscopically and part vaginally. The uterus is removed through the vagina.  Robot-assisted laparoscopic hysterectomy--A laparoscope and other tools are inserted into 3 or 4 small incisions in the abdomen. A computer-controlled device is used to give the surgeon a 3D image and to help control the surgical instruments. This allows for more precise movements of surgical instruments. The uterus is cut into small pieces and removed through the incisions or removed through the vagina. RISKS AND COMPLICATIONS  Possible complications associated with this procedure include:  Bleeding and risk of blood transfusion. Tell your health care provider if you do not want to receive any blood products.  Blood clots in the legs or lung.  Infection.  Injury to surrounding organs.  Problems or side effects related to anesthesia.  Conversion to an abdominal hysterectomy from one of the other techniques. WHAT TO EXPECT AFTER A HYSTERECTOMY  You will be given pain medicine.  You will need to have someone with you for the first 3-5 days after you go home.  You will need to follow up with your surgeon in 2-4 weeks after surgery to evaluate your progress.  You may have early menopause symptoms such as hot flashes, night sweats, and insomnia.  If you had a hysterectomy for a problem that was not cancer or not a condition that could lead to cancer, then you no longer need Pap tests. However, even if you no longer need a Pap test, a regular exam is a good idea to make sure no other problems are starting. Document Released: 12/14/2000 Document Revised: 04/10/2013 Document Reviewed: 02/25/2013 [facility] Patient Information 2015 [place], [place]. This information is not intended to replace advice given to you by your health care  provider. Make sure you discuss any questions you have with your health care provider.  

## 2015-02-20 NOTE — Progress Notes (Signed)
CLINIC ENCOUNTER NOTE  History:  44 y.o. G4P0040 here today for follow up for AUB. Was started on megace last months, feels this has stopped her AUB but she does not want to be on this medication indefinitely as she does not like how they make her feels. Desires definitive surgical management with hysterectomy. She denies any abnormal vaginal discharge, bleeding, pelvic pain or other concerns.   Past Medical History  Diagnosis Date  . Eczema     Past Surgical History  Procedure Laterality Date  . Dilation and curettage of uterus    . Cryotherapy      The following portions of the patient's history were reviewed and updated as appropriate: allergies, current medications, past family history, past medical history, past social history, past surgical history and problem list.   Health Maintenance:  Normal pap and negative HRHPV on 12/23/2014  Review of Systems:  Pertinent items are noted in HPI. Comprehensive review of systems was otherwise negative.  Objective:  Physical Exam BP 150/89 mmHg  Pulse 67  Resp 16  Ht 5\' 4"  (1.626 m)  Wt 134 lb (60.782 kg)  BMI 22.99 kg/m2  LMP 01/21/2015 CONSTITUTIONAL: Well-developed, well-nourished female in no acute distress.  HENT:  Normocephalic, atraumatic. External right and left ear normal. Oropharynx is clear and moist EYES: Conjunctivae and EOM are normal. Pupils are equal, round, and reactive to light. No scleral icterus.  NECK: Normal range of motion, supple, no masses SKIN: Skin is warm and dry. No rash noted. Not diaphoretic. No erythema. No pallor. [place]: Alert and oriented to person, place, and time. Normal reflexes, muscle tone coordination. No cranial nerve deficit noted. PSYCHIATRIC: Normal mood and affect. Normal behavior. Normal judgment and thought content. CARDIOVASCULAR: Normal heart rate noted RESPIRATORY: Effort and breath sounds normal, no problems with respiration noted ABDOMEN: Soft, no distention noted.     PELVIC: Deferred MUSCULOSKELETAL: Normal range of motion. No edema noted.  Labs and Imaging 01/08/2015 TRANSABDOMINAL AND TRANSVAGINAL ULTRASOUND OF PELVISCLINICAL DATA: Patient with chronic pelvic pain and dyspareunia.TECHNIQUE: Both transabdominal and transvaginal ultrasound examinations of the pelvis were performed. Transabdominal technique was performed for global imaging of the pelvis including uterus, ovaries, adnexal regions, and pelvic cul-de-sac. It was necessary to proceed with endovaginal exam following the transabdominal exam to visualize the endometrium and adnexal structures. COMPARISON: Pelvic ultrasound 01/08/2007 FINDINGS: Uterus Measurements: 8.3 x 4.9 x 5.2 cm. Multiple uterine fibroids are demonstrated. There is a 1.8 x 2.6 x 1.8 cm subserosal fibroid along the anterior uterine fundus. There is a 2.4 x 1.8 x 2.2 cm intramural fibroid within the posterior uterine body. There is a 2.9 x 2.6 x 2.7 cm intramural fibroid within the posterior uterine body. Endometrium Thickness: 4 mm. No focal abnormality visualized. Right ovary Measurements: 3.2 x 2.7 x 3.0 cm. There is a 2.7 cm simple cyst within the right ovary. Left ovary Measurements: 2.0 x 1.1 x 1.5 cm. Normal appearance/no adnexal mass. Other findings Trace free fluid in the pelvis. IMPRESSION: Fibroid uterus. Trace free fluid in the pelvis. Electronically Signed By: Lovey Newcomer M.D. On: 01/08/2015 16:29   Assessment & Plan:  Recommended Mirena IUD, endometrial ablation (Novasure/Hydrothermal Ablation) as alternatives to hysterectomy but she declines these methods " I want my uterus out, do not want any chance of having any more periods".  Patient desires definitive management with hysterectomy.  I proposed doing vaginal hysterectomy (VH) and possible prophylactic bilateral salpingectomy.  No indication for oophorectomy.  Patient agrees with this proposed  surgery.  The risks of surgery were discussed in detail  with the patient including but not limited to: bleeding which may require transfusion or reoperation; infection which may require antibiotics; injury to bowel, bladder, ureters or other surrounding organs; need for additional procedures including laparotomy; thromboembolic phenomenon, incisional problems and other postoperative/anesthesia complications.  Patient was also advised that she will remain in house for 1 night; and expected recovery time after a hysterectomy is 6-8 weeks.  Likelihood of success in alleviating the patient's symptoms was discussed.   She was told that she will be contacted by our surgical scheduler regarding the time and date of her surgery; routine preoperative instructions of having nothing to eat or drink after midnight on the day prior to surgery and also coming to the hospital 1 1/2 hours prior to her time of surgery were also emphasized.  She was told she may be called for a preoperative appointment about a week prior to surgery and will be given further preoperative instructions at that visit.  Routine postoperative instructions will be reviewed with the patient and her family in detail after surgery.  In the meantime, she will continue Megace; bleeding precautions were reviewed. Printed patient education handouts about the procedure was given to the patient to review at home.  Of note, patient has a self-pay status and will be going to meet with cashier's office to set up payment plan for this surgery.   Total face-to-face time with patient: 15 minutes. Over 50% of encounter was spent on counseling and coordination of care.   Verita Schneiders, MD, [place] Attending Obstetrician & Gynecologist, [place] for [facility]

## 2015-02-25 ENCOUNTER — Encounter (HOSPITAL_COMMUNITY): Payer: Self-pay | Admitting: *Deleted

## 2015-03-30 ENCOUNTER — Encounter (HOSPITAL_COMMUNITY)
Admission: RE | Admit: 2015-03-30 | Discharge: 2015-03-30 | Disposition: A | Discharge status: Home / Self Care | Payer: Self-pay | Source: Ambulatory Visit | Attending: Obstetrics & Gynecology | Admitting: Obstetrics & Gynecology

## 2015-03-30 ENCOUNTER — Encounter (HOSPITAL_COMMUNITY): Payer: Self-pay

## 2015-03-30 DIAGNOSIS — Z01818 Encounter for other preprocedural examination: Secondary | ICD-10-CM | POA: Insufficient documentation

## 2015-03-30 DIAGNOSIS — D259 Leiomyoma of uterus, unspecified: Secondary | ICD-10-CM | POA: Insufficient documentation

## 2015-03-30 DIAGNOSIS — N939 Abnormal uterine and vaginal bleeding, unspecified: Secondary | ICD-10-CM | POA: Insufficient documentation

## 2015-03-30 LAB — CBC
HEMATOCRIT: 45.1 % (ref 36.0–46.0)
Hemoglobin: 15.1 g/dL — ABNORMAL HIGH (ref 12.0–15.0)
MCH: 37.1 pg — ABNORMAL HIGH (ref 26.0–34.0)
MCHC: 33.5 g/dL (ref 30.0–36.0)
MCV: 110.8 fL — AB (ref 78.0–100.0)
PLATELETS: 248 10*3/uL (ref 150–400)
RBC: 4.07 MIL/uL (ref 3.87–5.11)
RDW: 13.5 % (ref 11.5–15.5)
WBC: 13.5 10*3/uL — AB (ref 4.0–10.5)

## 2015-03-30 LAB — TYPE AND SCREEN
ABO/RH(D): A NEG
ANTIBODY SCREEN: NEGATIVE

## 2015-03-30 LAB — ABO/RH: ABO/RH(D): A NEG

## 2015-03-30 MED ORDER — DEXTROSE 5 % IV SOLN
2.0000 g | INTRAVENOUS | Status: AC
  Administered 2015-03-31: 2 g via INTRAVENOUS
  Filled 2015-03-30: qty 2

## 2015-03-30 NOTE — Patient Instructions (Signed)
Your procedure is scheduled on:  March 31, 2015  Enter through the Main Entrance of [facility] at: 1:25 pm   Pick up the phone at the desk and dial [phone].  Call this number if you have problems the morning of surgery: [phone].  Remember: Do NOT eat food: after midnight tonight  Do NOT drink clear liquids after: midnight tonight  Take these medicines the morning of surgery with a SIP OF WATER: none   Do NOT wear jewelry (body piercing), metal hair clips/bobby pins, make-up, or nail polish. Do NOT wear lotions, powders, or perfumes.  You may wear deoderant. Do NOT shave for 48 hours prior to surgery. Do NOT bring valuables to the hospital. Contacts, dentures, or bridgework may not be worn into surgery. Leave suitcase in car.  After surgery it may be brought to your room.  For patients admitted to the hospital, checkout time is 11:00 AM the day of discharge.

## 2015-03-31 ENCOUNTER — Ambulatory Visit (HOSPITAL_COMMUNITY): Payer: Self-pay | Admitting: Anesthesiology

## 2015-03-31 ENCOUNTER — Encounter (HOSPITAL_COMMUNITY)
Admission: RE | Disposition: A | Discharge status: Home / Self Care | Payer: Self-pay | Source: Ambulatory Visit | Attending: Obstetrics & Gynecology

## 2015-03-31 ENCOUNTER — Encounter (HOSPITAL_COMMUNITY): Payer: Self-pay | Admitting: *Deleted

## 2015-03-31 ENCOUNTER — Inpatient Hospital Stay (HOSPITAL_COMMUNITY)
Admission: RE | Admit: 2015-03-31 | Discharge: 2015-03-31 | DRG: 743 | Disposition: A | Discharge status: Home / Self Care | Payer: Self-pay | Source: Ambulatory Visit | Attending: Obstetrics & Gynecology | Admitting: Obstetrics & Gynecology

## 2015-03-31 DIAGNOSIS — D252 Subserosal leiomyoma of uterus: Secondary | ICD-10-CM | POA: Diagnosis present

## 2015-03-31 DIAGNOSIS — F17210 Nicotine dependence, cigarettes, uncomplicated: Secondary | ICD-10-CM | POA: Diagnosis present

## 2015-03-31 DIAGNOSIS — N939 Abnormal uterine and vaginal bleeding, unspecified: Principal | ICD-10-CM | POA: Diagnosis present

## 2015-03-31 DIAGNOSIS — D219 Benign neoplasm of connective and other soft tissue, unspecified: Secondary | ICD-10-CM | POA: Diagnosis present

## 2015-03-31 DIAGNOSIS — D251 Intramural leiomyoma of uterus: Secondary | ICD-10-CM | POA: Diagnosis present

## 2015-03-31 DIAGNOSIS — Z90710 Acquired absence of both cervix and uterus: Secondary | ICD-10-CM | POA: Diagnosis present

## 2015-03-31 HISTORY — PX: VAGINAL HYSTERECTOMY: SHX2639

## 2015-03-31 LAB — PREGNANCY, URINE: PREG TEST UR: NEGATIVE

## 2015-03-31 SURGERY — HYSTERECTOMY, VAGINAL
Anesthesia: General | Site: Vagina

## 2015-03-31 MED ORDER — FENTANYL CITRATE (PF) 100 MCG/2ML IJ SOLN
INTRAMUSCULAR | Status: DC | PRN
  Administered 2015-03-31 (×2): 50 ug via INTRAVENOUS
  Administered 2015-03-31: 100 ug via INTRAVENOUS
  Administered 2015-03-31: 50 ug via INTRAVENOUS

## 2015-03-31 MED ORDER — HYDROMORPHONE HCL 1 MG/ML IJ SOLN
INTRAMUSCULAR | Status: AC
  Filled 2015-03-31: qty 1

## 2015-03-31 MED ORDER — ONDANSETRON HCL 4 MG/2ML IJ SOLN: 4.0000 mg | Freq: Once | INTRAMUSCULAR | Status: DC | PRN

## 2015-03-31 MED ORDER — GLYCOPYRROLATE 0.2 MG/ML IJ SOLN
INTRAMUSCULAR | Status: DC | PRN
  Administered 2015-03-31: 0.1 mg via INTRAVENOUS
  Administered 2015-03-31: 0.4 mg via INTRAVENOUS

## 2015-03-31 MED ORDER — MIDAZOLAM HCL 2 MG/2ML IJ SOLN
INTRAMUSCULAR | Status: AC
  Filled 2015-03-31: qty 4

## 2015-03-31 MED ORDER — FLUTICASONE PROPIONATE 0.005 % EX OINT
1.0000 | TOPICAL_OINTMENT | Freq: Two times a day (BID) | CUTANEOUS | Status: DC
Start: 2015-03-31 — End: 2015-03-31

## 2015-03-31 MED ORDER — ONDANSETRON HCL 4 MG/2ML IJ SOLN: 4.0000 mg | Freq: Four times a day (QID) | INTRAMUSCULAR | Status: DC | PRN

## 2015-03-31 MED ORDER — IBUPROFEN 600 MG PO TABS: 600.0000 mg | ORAL_TABLET | Freq: Four times a day (QID) | ORAL | Status: DC | PRN

## 2015-03-31 MED ORDER — DEXAMETHASONE SODIUM PHOSPHATE 4 MG/ML IJ SOLN
INTRAMUSCULAR | Status: AC
  Filled 2015-03-31: qty 1

## 2015-03-31 MED ORDER — ESTRADIOL 0.1 MG/GM VA CREA
TOPICAL_CREAM | VAGINAL | Status: AC
  Filled 2015-03-31: qty 42.5

## 2015-03-31 MED ORDER — DEXAMETHASONE SODIUM PHOSPHATE 10 MG/ML IJ SOLN
INTRAMUSCULAR | Status: DC | PRN
  Administered 2015-03-31: 4 mg via INTRAVENOUS

## 2015-03-31 MED ORDER — ALBUTEROL SULFATE HFA 108 (90 BASE) MCG/ACT IN AERS
INHALATION_SPRAY | RESPIRATORY_TRACT | Status: DC | PRN
  Administered 2015-03-31: 2 via RESPIRATORY_TRACT

## 2015-03-31 MED ORDER — DIPHENHYDRAMINE HCL 50 MG/ML IJ SOLN
INTRAMUSCULAR | Status: AC
  Filled 2015-03-31: qty 1

## 2015-03-31 MED ORDER — PREDNISONE 5 MG PO TABS
5.0000 mg | ORAL_TABLET | Freq: Every day | ORAL | Status: DC
  Filled 2015-03-31: qty 1

## 2015-03-31 MED ORDER — KETOROLAC TROMETHAMINE 30 MG/ML IJ SOLN
INTRAMUSCULAR | Status: AC
  Filled 2015-03-31: qty 1

## 2015-03-31 MED ORDER — LACTATED RINGERS IV SOLN: INTRAVENOUS | Status: DC

## 2015-03-31 MED ORDER — FENTANYL CITRATE (PF) 250 MCG/5ML IJ SOLN
INTRAMUSCULAR | Status: AC
  Filled 2015-03-31: qty 25

## 2015-03-31 MED ORDER — PROPOFOL 10 MG/ML IV BOLUS
INTRAVENOUS | Status: DC | PRN
  Administered 2015-03-31: 160 mg via INTRAVENOUS

## 2015-03-31 MED ORDER — KETOROLAC TROMETHAMINE 30 MG/ML IJ SOLN
30.0000 mg | Freq: Once | INTRAMUSCULAR | Status: AC
  Administered 2015-03-31: 30 mg via INTRAVENOUS

## 2015-03-31 MED ORDER — ROCURONIUM BROMIDE 100 MG/10ML IV SOLN
INTRAVENOUS | Status: AC
  Filled 2015-03-31: qty 1

## 2015-03-31 MED ORDER — NEOSTIGMINE METHYLSULFATE 10 MG/10ML IV SOLN
INTRAVENOUS | Status: DC | PRN
  Administered 2015-03-31: 1 mg via INTRAVENOUS

## 2015-03-31 MED ORDER — ONDANSETRON HCL 4 MG/2ML IJ SOLN
INTRAMUSCULAR | Status: AC
  Filled 2015-03-31: qty 2

## 2015-03-31 MED ORDER — OXYCODONE-ACETAMINOPHEN 5-325 MG PO TABS: 1.0000 | ORAL_TABLET | ORAL | Status: DC | PRN

## 2015-03-31 MED ORDER — PROPOFOL 10 MG/ML IV BOLUS
INTRAVENOUS | Status: AC
  Filled 2015-03-31: qty 20

## 2015-03-31 MED ORDER — LACTATED RINGERS IV SOLN
INTRAVENOUS | Status: DC
  Administered 2015-03-31 (×3): via INTRAVENOUS

## 2015-03-31 MED ORDER — HYDROMORPHONE HCL 1 MG/ML IJ SOLN: 0.5000 mg | INTRAMUSCULAR | Status: DC | PRN

## 2015-03-31 MED ORDER — BUPIVACAINE-EPINEPHRINE (PF) 0.5% -1:200000 IJ SOLN
INTRAMUSCULAR | Status: AC
  Filled 2015-03-31: qty 30

## 2015-03-31 MED ORDER — DOCUSATE SODIUM 100 MG PO CAPS: 100.0000 mg | ORAL_CAPSULE | Freq: Two times a day (BID) | ORAL | Status: DC | PRN

## 2015-03-31 MED ORDER — BUPIVACAINE-EPINEPHRINE 0.5% -1:200000 IJ SOLN
INTRAMUSCULAR | Status: DC | PRN
  Administered 2015-03-31: 10 mL
  Administered 2015-03-31: 20 mL

## 2015-03-31 MED ORDER — ONDANSETRON HCL 4 MG/2ML IJ SOLN
INTRAMUSCULAR | Status: DC | PRN
  Administered 2015-03-31: 4 mg via INTRAVENOUS

## 2015-03-31 MED ORDER — SCOPOLAMINE 1 MG/3DAYS TD PT72
MEDICATED_PATCH | TRANSDERMAL | Status: AC
  Administered 2015-03-31: 1.5 mg via TRANSDERMAL
  Filled 2015-03-31: qty 1

## 2015-03-31 MED ORDER — FENTANYL CITRATE (PF) 100 MCG/2ML IJ SOLN: 25.0000 ug | INTRAMUSCULAR | Status: DC | PRN

## 2015-03-31 MED ORDER — ROCURONIUM BROMIDE 100 MG/10ML IV SOLN
INTRAVENOUS | Status: DC | PRN
  Administered 2015-03-31: 40 mg via INTRAVENOUS
  Administered 2015-03-31: 10 mg via INTRAVENOUS

## 2015-03-31 MED ORDER — ONDANSETRON HCL 4 MG PO TABS: 4.0000 mg | ORAL_TABLET | Freq: Four times a day (QID) | ORAL | Status: DC | PRN

## 2015-03-31 MED ORDER — MENTHOL 3 MG MT LOZG: 1.0000 | LOZENGE | OROMUCOSAL | Status: DC | PRN

## 2015-03-31 MED ORDER — MIDAZOLAM HCL 2 MG/2ML IJ SOLN
INTRAMUSCULAR | Status: DC | PRN
  Administered 2015-03-31: 2 mg via INTRAVENOUS

## 2015-03-31 MED ORDER — SCOPOLAMINE 1 MG/3DAYS TD PT72
1.0000 | MEDICATED_PATCH | Freq: Once | TRANSDERMAL | Status: DC
  Administered 2015-03-31: 1.5 mg via TRANSDERMAL

## 2015-03-31 MED ORDER — DIPHENHYDRAMINE HCL 50 MG/ML IJ SOLN
INTRAMUSCULAR | Status: DC | PRN
  Administered 2015-03-31: 12.5 mg via INTRAVENOUS

## 2015-03-31 MED ORDER — HYDROMORPHONE HCL 1 MG/ML IJ SOLN
INTRAMUSCULAR | Status: DC | PRN
  Administered 2015-03-31: 0.5 mg via INTRAVENOUS
  Administered 2015-03-31: 1 mg via INTRAVENOUS

## 2015-03-31 MED ORDER — PANTOPRAZOLE SODIUM 40 MG PO TBEC: 40.0000 mg | DELAYED_RELEASE_TABLET | Freq: Every day | ORAL | Status: DC

## 2015-03-31 MED ORDER — DOCUSATE SODIUM 100 MG PO CAPS: 100.0000 mg | ORAL_CAPSULE | Freq: Two times a day (BID) | ORAL | Status: DC

## 2015-03-31 MED ORDER — SIMETHICONE 80 MG PO CHEW: 80.0000 mg | CHEWABLE_TABLET | Freq: Four times a day (QID) | ORAL | Status: DC | PRN

## 2015-03-31 MED ORDER — OXYCODONE-ACETAMINOPHEN 5-325 MG PO TABS: 1.0000 | ORAL_TABLET | Freq: Four times a day (QID) | ORAL | Status: DC | PRN

## 2015-03-31 MED ORDER — LIDOCAINE HCL (CARDIAC) 20 MG/ML IV SOLN
INTRAVENOUS | Status: AC
  Filled 2015-03-31: qty 5

## 2015-03-31 SURGICAL SUPPLY — 26 items
CANISTER SUCT 3000ML (MISCELLANEOUS) ×4 IMPLANT
CLOTH BEACON ORANGE TIMEOUT ST (SAFETY) ×4 IMPLANT
CONT PATH 16OZ SNAP LID 3702 (MISCELLANEOUS) IMPLANT
DECANTER SPIKE VIAL GLASS SM (MISCELLANEOUS) ×3 IMPLANT
GAUZE PACKING 2X5 YD STRL (GAUZE/BANDAGES/DRESSINGS) IMPLANT
GLOVE BIOGEL PI IND STRL 6.5 (GLOVE) ×2 IMPLANT
GLOVE BIOGEL PI IND STRL 7.0 (GLOVE) ×5 IMPLANT
GLOVE BIOGEL PI INDICATOR 6.5 (GLOVE) ×2
GLOVE BIOGEL PI INDICATOR 7.0 (GLOVE) ×6
GLOVE ECLIPSE 7.0 STRL STRAW (GLOVE) ×4 IMPLANT
GOWN STRL REUS W/TWL LRG LVL3 (GOWN DISPOSABLE) ×19 IMPLANT
NDL SPNL 22GX3.5 QUINCKE BK (NEEDLE) IMPLANT
NEEDLE HYPO 22GX1.5 SAFETY (NEEDLE) ×4 IMPLANT
NEEDLE SPNL 22GX3.5 QUINCKE BK (NEEDLE) IMPLANT
NS IRRIG 1000ML POUR BTL (IV SOLUTION) ×4 IMPLANT
PACK VAGINAL WOMENS (CUSTOM PROCEDURE TRAY) ×4 IMPLANT
PAD OB MATERNITY 4.3X12.25 (PERSONAL CARE ITEMS) ×4 IMPLANT
SHEARS FOC LG CVD HARMONIC 17C (MISCELLANEOUS) IMPLANT
SUT VIC AB 0 CT1 18XCR BRD8 (SUTURE) ×6 IMPLANT
SUT VIC AB 0 CT1 27 (SUTURE) ×8
SUT VIC AB 0 CT1 27XBRD ANBCTR (SUTURE) ×4 IMPLANT
SUT VIC AB 0 CT1 8-18 (SUTURE) ×12
SUT VICRYL 0 TIES 12 18 (SUTURE) ×4 IMPLANT
TOWEL OR 17X24 6PK STRL BLUE (TOWEL DISPOSABLE) ×8 IMPLANT
TRAY FOLEY CATH SILVER 14FR (SET/KITS/TRAYS/PACK) ×4 IMPLANT
WATER STERILE IRR 1000ML POUR (IV SOLUTION) ×1 IMPLANT

## 2015-03-31 NOTE — H&P (Signed)
Preoperative History and Physical  Kimberly [name] is a 44 y.o. G4P0040 here for surgical management of AUB.   No significant preoperative concerns.  Proposed surgery: Total vaginal hysterectomy (TVH) and possible prophylactic bilateral salpingectomy.  Past Medical History  Diagnosis Date  . Eczema    Past Surgical History  Procedure Laterality Date  . Dilation and curettage of uterus    . Cryotherapy     OB History  Gravida Para Term Preterm AB SAB TAB Ectopic Multiple Living  4    4 4     0    # Outcome Date GA Lbr Len/2nd Weight Sex Delivery Anes PTL Lv  4 SAB           3 SAB           2 SAB           1 SAB             Patient denies any other pertinent gynecologic issues.   No current facility-administered medications on file prior to encounter.   Current Outpatient Prescriptions on File Prior to Encounter  Medication Sig Dispense Refill  . fluticasone (CUTIVATE) 0.005 % ointment Apply 1 application topically 2 (two) times daily.    . megestrol (MEGACE) 20 MG tablet Take 2 tablets (40 mg total) by mouth daily. Can increase to two tablets twice a day in the event of heavy bleeding 90 tablet 5  . predniSONE (DELTASONE) 5 MG tablet Take 5 mg by mouth daily with breakfast.     No Known Allergies  Social History:   reports that she has been smoking.  She has never used smokeless tobacco. She reports that she does not drink alcohol or use illicit drugs.  No family history on file.  Review of Systems: Noncontributory  PHYSICAL EXAM: AFVSS CONSTITUTIONAL: Well-developed, well-nourished female in no acute distress.  HENT:  Normocephalic, atraumatic, External right and left ear normal. Oropharynx is clear and moist EYES: Conjunctivae and EOM are normal. Pupils are equal, round, and reactive to light. No scleral icterus.  NECK: Normal range of motion, supple, no masses SKIN: Skin is warm and dry. No rash noted. Not diaphoretic. No erythema. No pallor. [place]: Alert and  oriented to person, place, and time. Normal reflexes, muscle tone coordination. No cranial nerve deficit noted. PSYCHIATRIC: Normal mood and affect. Normal behavior. Normal judgment and thought content. CARDIOVASCULAR: Normal heart rate noted, regular rhythm RESPIRATORY: Effort and breath sounds normal, no problems with respiration noted ABDOMEN: Soft, nontender, nondistended. PELVIC: Deferred MUSCULOSKELETAL: Normal range of motion. No edema and no tenderness. 2+ distal pulses.  Labs: Results for orders placed or performed during the hospital encounter of 03/31/15 (from the past 336 hour(s))  Pregnancy, urine   Collection Time: 03/31/15  1:30 PM  Result Value Ref Range   Preg Test, Ur NEGATIVE NEGATIVE  Results for orders placed or performed during the hospital encounter of 03/30/15 (from the past 336 hour(s))  CBC   Collection Time: 03/30/15 10:50 AM  Result Value Ref Range   WBC 13.5 (H) 4.0 - 10.5 K/uL   RBC 4.07 3.87 - 5.11 MIL/uL   Hemoglobin 15.1 (H) 12.0 - 15.0 g/dL   HCT 45.1 36.0 - 46.0 %   MCV 110.8 (H) 78.0 - 100.0 fL   MCH 37.1 (H) 26.0 - 34.0 pg   MCHC 33.5 30.0 - 36.0 g/dL   RDW 13.5 11.5 - 15.5 %   Platelets 248 150 - 400 K/uL  Type and screen  Collection Time: 03/30/15 10:50 AM  Result Value Ref Range   ABO/RH(D) A NEG    Antibody Screen NEG    Sample Expiration 04/02/2015   ABO/Rh   Collection Time: 03/30/15 10:50 AM  Result Value Ref Range   ABO/RH(D) A NEG     Imaging Studies: 01/08/2015 TRANSABDOMINAL AND TRANSVAGINAL ULTRASOUND OF PELVISCLINICAL DATA: Patient with chronic pelvic pain and dyspareunia.TECHNIQUE: Both transabdominal and transvaginal ultrasound examinations of the pelvis were performed. Transabdominal technique was performed for global imaging of the pelvis including uterus, ovaries, adnexal regions, and pelvic cul-de-sac. It was necessary to proceed with endovaginal exam following the transabdominal exam to visualize the endometrium and  adnexal structures. COMPARISON: Pelvic ultrasound 01/08/2007 FINDINGS: Uterus Measurements: 8.3 x 4.9 x 5.2 cm. Multiple uterine fibroids are demonstrated. There is a 1.8 x 2.6 x 1.8 cm subserosal fibroid along the anterior uterine fundus. There is a 2.4 x 1.8 x 2.2 cm intramural fibroid within the posterior uterine body. There is a 2.9 x 2.6 x 2.7 cm intramural fibroid within the posterior uterine body. Endometrium Thickness: 4 mm. No focal abnormality visualized. Right ovary Measurements: 3.2 x 2.7 x 3.0 cm. There is a 2.7 cm simple cyst within the right ovary. Left ovary Measurements: 2.0 x 1.1 x 1.5 cm. Normal appearance/no adnexal mass. Other findings Trace free fluid in the pelvis. IMPRESSION: Fibroid uterus. Trace free fluid in the pelvis. Electronically Signed By: Lovey Newcomer M.D. On: 01/08/2015 16:29   Assessment: Patient Active Problem List   Diagnosis Date Noted  . Abnormal uterine bleeding (AUB) 03/31/2015    Plan: Patient will undergo surgical management with total vaginal hysterectomy (TVH) and possible prophylactic bilateral salpingectomy.   The risks of surgery were discussed in detail with the patient including but not limited to: bleeding which may require transfusion or reoperation; infection which may require antibiotics; injury to surrounding organs which may involve bowel, bladder, ureters ; need for additional procedures including laparoscopy or laparotomy; thromboembolic phenomenon, surgical site problems and other postoperative/anesthesia complications. Likelihood of success in alleviating the patient's condition was discussed. Routine postoperative instructions will be reviewed with the patient and her family in detail after surgery.  The patient concurred with the proposed plan, giving informed written consent for the surgery.  Patient has been NPO since last night she will remain NPO for procedure.  Anesthesia and OR aware.  Preoperative prophylactic  antibiotics and SCDs ordered on call to the OR.  To OR when ready.  Verita Schneiders, M.D. 03/31/2015 3:15 PM

## 2015-03-31 NOTE — Anesthesia Postprocedure Evaluation (Signed)
  Anesthesia Post-op Note  Patient: Kimberly [name]  Procedure(s) Performed: Procedure(s) (LRB): HYSTERECTOMY VAGINAL (N/A)  Patient Location: PACU  Anesthesia Type: General  Level of Consciousness: awake and alert   Airway and Oxygen Therapy: Patient Spontanous Breathing  Post-op Pain: mild  Post-op Assessment: Post-op Vital signs reviewed, Patient's Cardiovascular Status Stable, Respiratory Function Stable, Patent Airway and No signs of Nausea or vomiting  Last Vitals:  Filed Vitals:   03/31/15 1658  Pulse: 77  Temp: 37.1 C    Post-op Vital Signs: stable   Complications: No apparent anesthesia complications

## 2015-03-31 NOTE — Op Note (Addendum)
Kimberly [name] PROCEDURE DATE: 03/31/2015  PREOPERATIVE DIAGNOSES:  Abnormal uterine bleeding, fibroids POSTOPERATIVE DIAGNOSES:  The same SURGEON:   Verita Schneiders, M.D. ASSISTANT: Darron Doom, M.D. OPERATION:  Total Vaginal Hysterectomy ANESTHESIA:  General endotracheal.  INDICATIONS: The patient is a 44 y.o. G4P0040 with history of abnormal uterine bleeding and small fibroids. The patient made a decision to undergo definite surgical treatment. On the preoperative visit, the risks, benefits, indications, and alternatives of the procedure were reviewed with the patient.  On the day of surgery, the risks of surgery were again discussed with the patient including but not limited to: bleeding which may require transfusion or reoperation; infection which may require antibiotics; injury to bowel, bladder, ureters or other surrounding organs; need for additional procedures; thromboembolic phenomenon, incisional problems and other postoperative/anesthesia complications. Written informed consent was obtained.    OPERATIVE FINDINGS: A 8 week size fibroid uterus.  Adnexa not visualized bilaterally.  ESTIMATED BLOOD LOSS: 100 ml FLUIDS:  2000 ml of Lactated Ringers URINE OUTPUT:  300 ml of clear yellow urine. SPECIMENS:  Uterus and cervix sent to pathology COMPLICATIONS:  None immediate.  DESCRIPTION OF PROCEDURE:  The patient received intravenous antibiotics and had sequential compression devices applied to her lower extremities while in the preoperative area.  She was then taken to the operating room where general anesthesia was administered and was found to be adequate.  She was placed in the dorsal lithotomy position, and was prepped and draped in a sterile manner.  A Foley catheter was inserted into her bladder and attached to constant drainage. After an adequate timeout was performed, attention was turned to her pelvis.  A weighted speculum was then placed in the vagina, and the anterior and posterior  lips of the cervix were grasped bilaterally with tenaculums.  The cervix was then injected circumferentially with 0.5% Marcaine with epinephrine solution to maintain hemostasis.  The cervix was then circumferentially incised, and the bladder was dissected off the pubocervical fascia anteriorly without complication.  The anterior cul-de-sac was then entered sharply without difficulty and a retractor was placed.  The same procedure was performed posteriorly and the posterior cul-de-sac was entered sharply without difficulty.  A long weighted speculum was inserted into the posterior cul-de-sac.  The Heaney clamp was then used to clamp the uterosacral ligaments on either side.  They were then cut and sutured ligated with 0 Vicryl, and the ligated uterosacral ligaments were transfixed to the ipsilateral vaginal epithelium to further support the vagina and provide hemostasis. Of note, all sutures used in this case were 0 Vicryl unless otherwise noted.   The cardinal ligaments were then clamped, cut and ligated. The uterine vessels and broad ligaments were then serially clamped with the Heaney clamps, cut, and suture ligated on both sides.  Excellent hemostasis was noted at this point.  The uterus was then delivered via the posterior cul-de-sac, and the cornua were clamped with the Heaney clamps, transected, and the uterus was delivered and sent to pathology. These pedicles were then suture ligated to ensure hemostasis.  After completion of the hysterectomy, all pedicles from the uterosacral ligament to the cornua were examined hemostasis was confirmed.  The vaginal cuff was then closed with a in a running locked fashion with care given to incorporate the uterosacral pedicles bilaterally.  All instruments were then removed from the pelvis.  The patient tolerated the procedure well.  All instruments, needles, and sponge counts were correct x 2. The patient was taken to the recovery room in  stable condition.     Verita Schneiders, MD, [place] Attending [place], [facility]

## 2015-03-31 NOTE — Transfer of Care (Signed)
Immediate Anesthesia Transfer of Care Note  Patient: Kimberly [name]  Procedure(s) Performed: Procedure(s): HYSTERECTOMY VAGINAL (N/A)  Patient Location: PACU  Anesthesia Type:General  Level of Consciousness: awake, alert  and oriented  Airway & Oxygen Therapy: Patient Spontanous Breathing and Patient connected to nasal cannula oxygen  Post-op Assessment: Report given to RN and Post -op Vital signs reviewed and stable  Post vital signs: Reviewed and stable  Last Vitals: There were no vitals filed for this visit.  Complications: No apparent anesthesia complications

## 2015-03-31 NOTE — Progress Notes (Signed)
Pt A/O x4. No c/o pain. Vital signs WNL. Reviewed pt discharge instructions, questions answered. Rx for percocet given to patient. Belongings accounted for. Pt accompanied to vehicle by nursing staff. Confirmed with Dr. Elly Modena no vaginal packing in place.

## 2015-03-31 NOTE — Anesthesia Preprocedure Evaluation (Signed)
Anesthesia Evaluation  Patient identified by MRN, date of birth, ID band Patient awake    Reviewed: Allergy & Precautions, NPO status , Patient's Chart, lab work & pertinent test results  History of Anesthesia Complications Negative for: history of anesthetic complications  Airway Mallampati: II  TM Distance: >3 FB Neck ROM: Full    Dental no notable dental hx. (+) Dental Advisory Given   Pulmonary Current Smoker,    Pulmonary exam normal breath sounds clear to auscultation       Cardiovascular negative cardio ROS Normal cardiovascular exam Rhythm:Regular Rate:Normal     Neuro/Psych negative neurological ROS  negative psych ROS   GI/Hepatic negative GI ROS, Neg liver ROS,   Endo/Other  negative endocrine ROS  Renal/GU negative Renal ROS  negative genitourinary   Musculoskeletal negative musculoskeletal ROS (+)   Abdominal   Peds negative pediatric ROS (+)  Hematology negative hematology ROS (+)   Anesthesia Other Findings   Reproductive/Obstetrics negative OB ROS                             Anesthesia Physical Anesthesia Plan  ASA: II  Anesthesia Plan: General   Post-op Pain Management:    Induction: Intravenous  Airway Management Planned: Oral ETT  Additional Equipment:   Intra-op Plan:   Post-operative Plan: Extubation in OR  Informed Consent: I have reviewed the patients History and Physical, chart, labs and discussed the procedure including the risks, benefits and alternatives for the proposed anesthesia with the patient or authorized representative who has indicated his/her understanding and acceptance.   Dental advisory given  Plan Discussed with: CRNA  Anesthesia Plan Comments:         Anesthesia Quick Evaluation

## 2015-03-31 NOTE — Discharge Instructions (Signed)
Vaginal Hysterectomy, Care After Refer to this sheet in the next few weeks. These instructions provide you with information on caring for yourself after your procedure. Your health care provider may also give you more specific instructions. Your treatment has been planned according to current medical practices, but problems sometimes occur. Call your health care provider if you have any problems or questions after your procedure. WHAT TO EXPECT AFTER THE PROCEDURE After your procedure, it is typical to have the following:  Abdominal pain. You will be given pain medicine to control it.  Sore throat from the breathing tube that was inserted during surgery. HOME CARE INSTRUCTIONS  Only take over-the-counter or prescription medicines for pain, discomfort, or fever as directed by your health care provider.  Do not take aspirin. It can cause bleeding.  Do not drive when taking pain medicine.  Follow your health care provider's advice regarding diet, exercise, lifting, driving, and general activities.  Resume your usual diet as directed and allowed.  Get plenty of rest and sleep.  Do not douche, use tampons, or have sexual intercourse for at least 8 weeks, or until your health care provider gives you permission.  Monitor your temperature and notify your health care provider of a fever.  Take showers instead of baths for 2-3 weeks.  Do not drink alcohol until your health care provider gives you permission.  If you develop constipation, you may take a mild laxative with your health care provider's permission. Bran foods may help with constipation problems. Drinking enough fluids to keep your urine clear or pale yellow may help as well.  Try to have someone home with you for 1-2 weeks to help around the house.  Keep all of your follow-up appointments as directed by your health care provider. SEEK MEDICAL CARE IF:   You feel dizzy or lightheaded.  You have pain or bleeding when you  urinate.  You have persistent diarrhea.  You have persistent nausea and vomiting.  You have abnormal vaginal discharge.  You have a rash.  You have any type of abnormal reaction or develop an allergy to your medicine.  You have poor pain control with your prescribed medicine. SEEK IMMEDIATE MEDICAL CARE IF:   You have a fever.  You have severe abdominal pain.  You have chest pain.  You have shortness of breath.  You faint.  You have pain, swelling, or redness in your leg.  You have heavy vaginal bleeding with blood clots. MAKE SURE YOU:  Understand these instructions.  Will watch your condition.  Will get help right away if you are not doing well or get worse. Document Released: 06/09/2011 Document Revised: 06/25/2013 Document Reviewed: 01/03/2013 [facility] Patient Information 2015 [place], [place]. This information is not intended to replace advice given to you by your health care provider. Make sure you discuss any questions you have with your health care provider.

## 2015-03-31 NOTE — Anesthesia Procedure Notes (Signed)
Procedure Name: Intubation Date/Time: 03/31/2015 4:38 PM Performed by: Flossie Dibble Pre-anesthesia Checklist: Patient identified, Emergency Drugs available, Timeout performed, Suction available and Patient being monitored Patient Re-evaluated:Patient Re-evaluated prior to inductionOxygen Delivery Method: Circle system utilized Preoxygenation: Pre-oxygenation with 100% oxygen Intubation Type: IV induction Ventilation: Oral airway inserted - appropriate to patient size and Mask ventilation without difficulty Laryngoscope Size: Mac and 3 Grade View: Grade I Tube size: 7.0 mm Number of attempts: 1 Airway Equipment and Method: Stylet Placement Confirmation: ETT inserted through vocal cords under direct vision,  positive ETCO2 and breath sounds checked- equal and bilateral Secured at: 21.5 cm Tube secured with: Tape Dental Injury: Teeth and Oropharynx as per pre-operative assessment

## 2015-04-01 ENCOUNTER — Encounter (HOSPITAL_COMMUNITY): Payer: Self-pay | Admitting: Obstetrics & Gynecology

## 2015-04-01 NOTE — Discharge Summary (Signed)
Gynecology Physician Postoperative Discharge Summary  Patient ID: Kimberly [name] MRN: [id number] DOB/AGE: 1971/04/16 44 y.o.  Admit Date: 03/31/2015 Discharge Date: 03/31/2015  Preoperative Diagnoses: Abnormal uterine bleeding, fibroids  Procedures: TOTAL HYSTERECTOMY VAGINAL   Significant Labs: CBC Latest Ref Rng 03/30/2015 12/23/2014  WBC 4.0 - 10.5 K/uL 13.5(H) 7.7  Hemoglobin 12.0 - 15.0 g/dL 15.1(H) 15.1(H)  Hematocrit 36.0 - 46.0 % 45.1 43.7  Platelets 150 - 400 K/uL 248 220    Hospital Course:  Kimberly [name] is a 44 y.o. G4P0040  admitted for scheduled surgery.  She underwent the procedure as mentioned above, her operation was uncomplicated. For further details about surgery, please refer to the operative report. Patient had an uncomplicated postoperative course, and desired discharge about 5 hours after surgery.  By time of discharge later in the day of POD#0, her pain was controlled on oral pain medications; she was ambulating, voiding without difficulty and tolerating regular diet. She was deemed stable for discharge to home.   Discharge Exam: Blood pressure 137/94, pulse 72, temperature 98.3 F (36.8 C), temperature source Oral, resp. rate 18, height 5\' 4"  (1.626 m), weight 141 lb (63.957 kg), SpO2 100 %. General appearance: alert and no distress  Resp: clear to auscultation bilaterally  Cardio: regular rate and rhythm  GI: soft, non-tender; bowel sounds normal; no masses, no organomegaly.  Incision: C/D/I, no erythema, no drainage noted Pelvic: scant blood on pad  Extremities: extremities normal, atraumatic, no cyanosis or edema and Homans sign is negative, no sign of DVT  Discharged Condition: Stable  Disposition: 01-Home or Self Care     Medication List    TAKE these medications        docusate sodium 100 MG capsule  Commonly known as:  COLACE  Take 1 capsule (100 mg total) by mouth 2 (two) times daily as needed.     docusate sodium 100 MG capsule  Commonly  known as:  COLACE  Take 1 capsule (100 mg total) by mouth 2 (two) times daily.     fluticasone 0.005 % ointment  Commonly known as:  CUTIVATE  Apply 1 application topically 2 (two) times daily.     ibuprofen 600 MG tablet  Commonly known as:  ADVIL,MOTRIN  Take 1 tablet (600 mg total) by mouth every 6 (six) hours as needed.     ibuprofen 600 MG tablet  Commonly known as:  ADVIL,MOTRIN  Take 1 tablet (600 mg total) by mouth every 6 (six) hours as needed (mild pain).     megestrol 20 MG tablet  Commonly known as:  MEGACE  Take 2 tablets (40 mg total) by mouth daily. Can increase to two tablets twice a day in the event of heavy bleeding     oxyCODONE-acetaminophen 5-325 MG tablet  Commonly known as:  PERCOCET/ROXICET  Take 1-2 tablets by mouth every 6 (six) hours as needed.     predniSONE 5 MG tablet  Commonly known as:  DELTASONE  Take 5 mg by mouth daily with breakfast.       Follow-up Information    Follow up with ANYANWU,UGONNA A, MD. Schedule an appointment as soon as possible for a visit in 2 weeks.   Specialty:  Obstetrics and Gynecology   Why:  for postoperative appointment. Call clinic/come to [facility] for any concerning issues   Contact information:   [address] [place] [address] [phone]       Signed:  Verita Schneiders, MD, [place] Attending [facility], [place]

## 2015-04-03 ENCOUNTER — Encounter: Payer: Self-pay | Admitting: *Deleted

## 2015-11-04 IMAGING — US US TRANSVAGINAL NON-OB
1 series · 13 of 25 positions shown · non-contrast
Comparison: Pelvic ultrasound 01/08/2007

CLINICAL DATA: Patient with chronic pelvic pain and dyspareunia.

EXAM:
TRANSABDOMINAL AND TRANSVAGINAL ULTRASOUND OF PELVIS
TECHNIQUE: Both transabdominal and transvaginal ultrasound examinations of the
pelvis were performed. Transabdominal technique was performed for
global imaging of the pelvis including uterus, ovaries, adnexal
regions, and pelvic cul-de-sac. It was necessary to proceed with
endovaginal exam following the transabdominal exam to visualize the
endometrium and adnexal structures.

[Series 1: us pelvis complete · 13 of 74 slices shown]
[im 1/74]
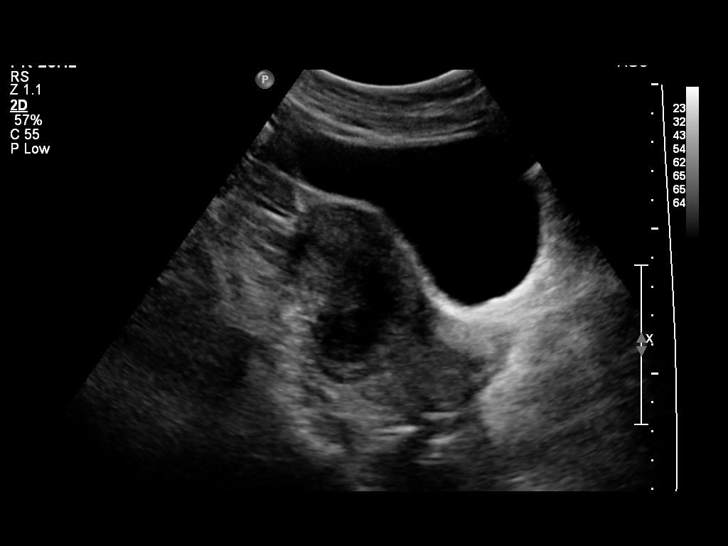
[im 7/74]
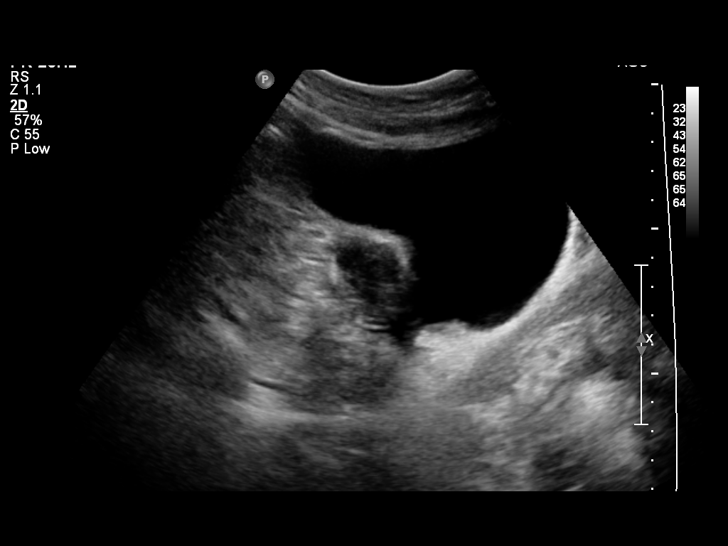
[im 13/74]
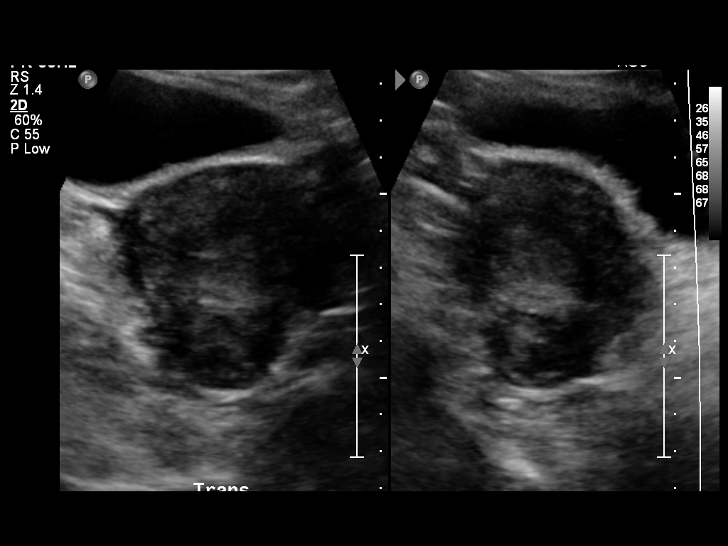
[im 19/74]
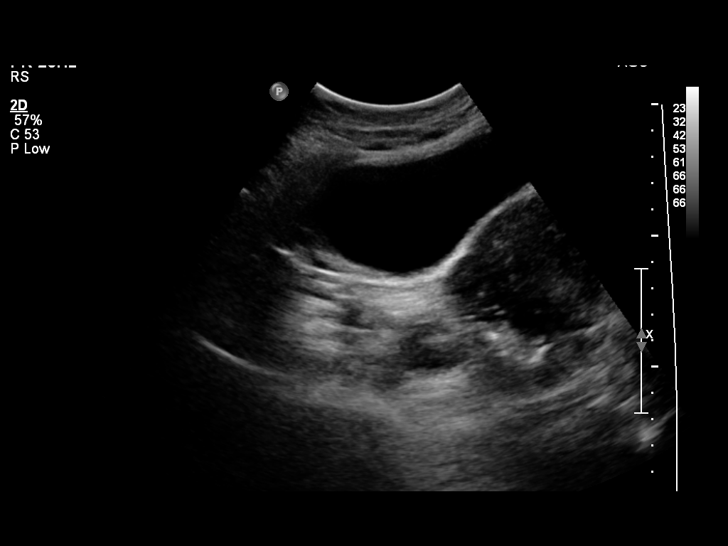
[im 25/74]
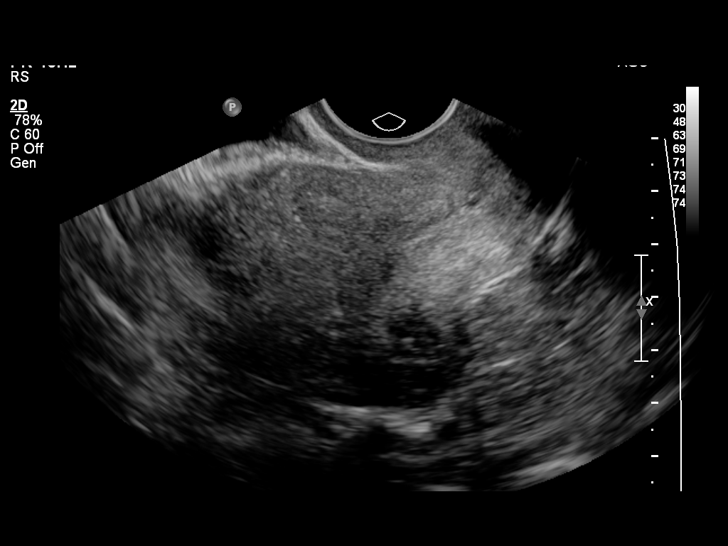
[im 31/74]
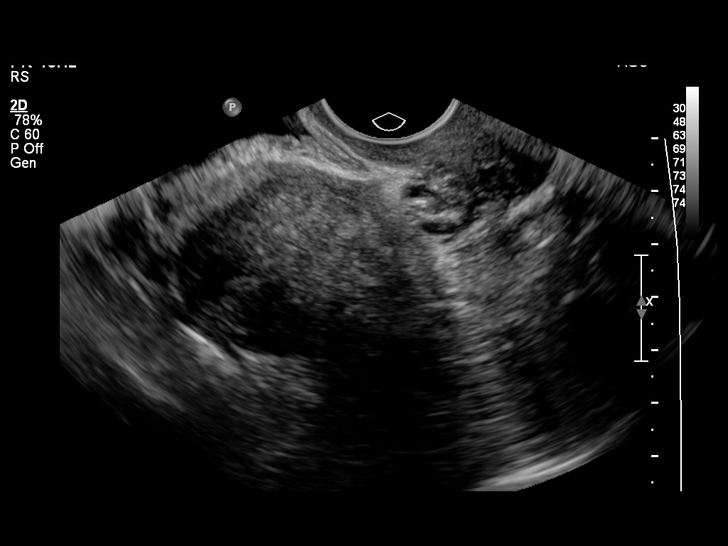
[im 37/74]
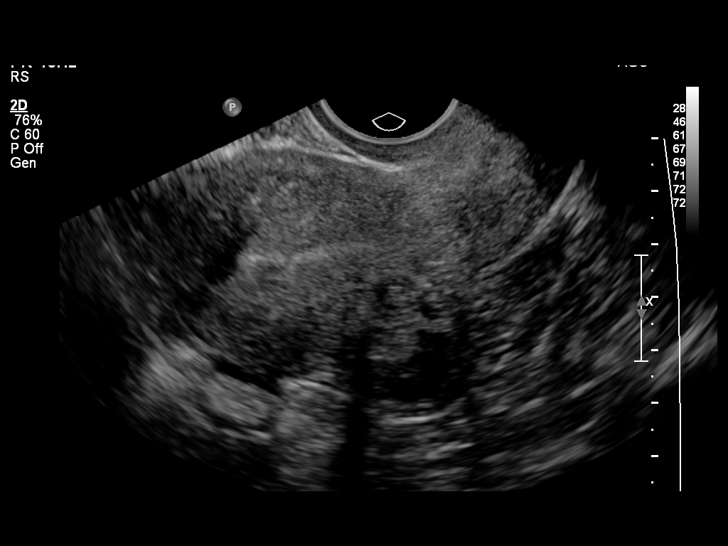
[im 43/74]
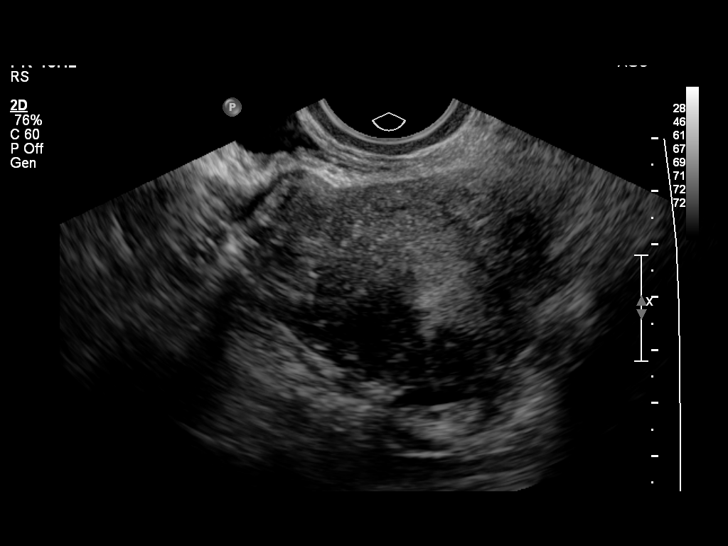
[im 49/74]
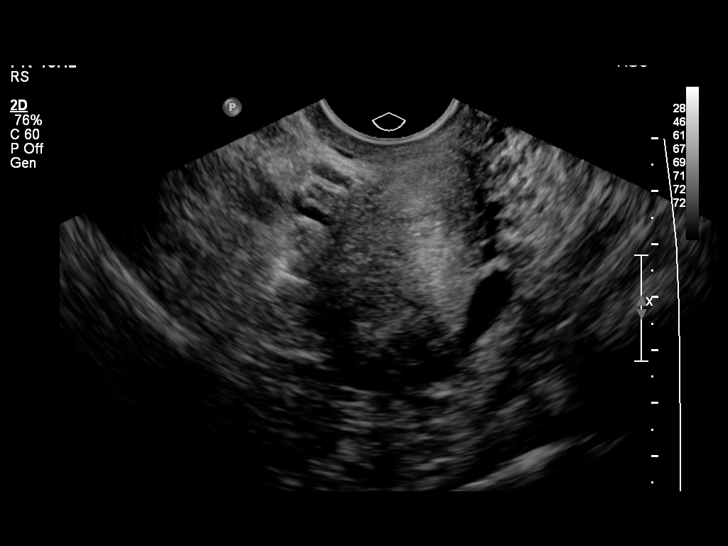
[im 55/74]
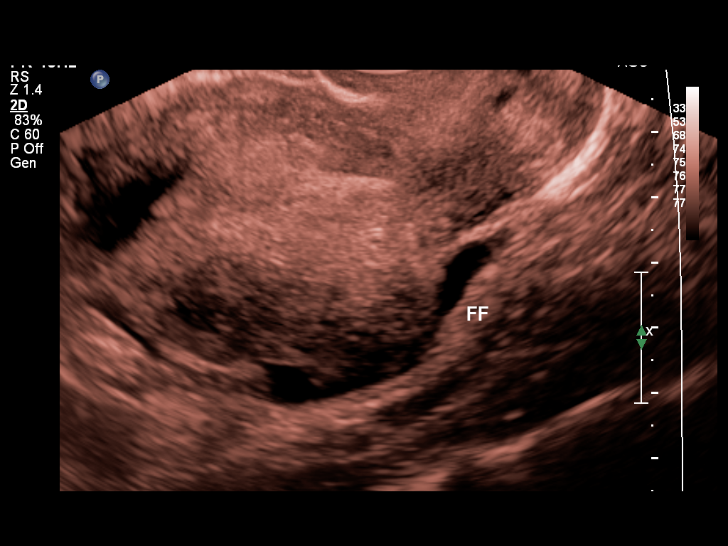
[im 61/74]
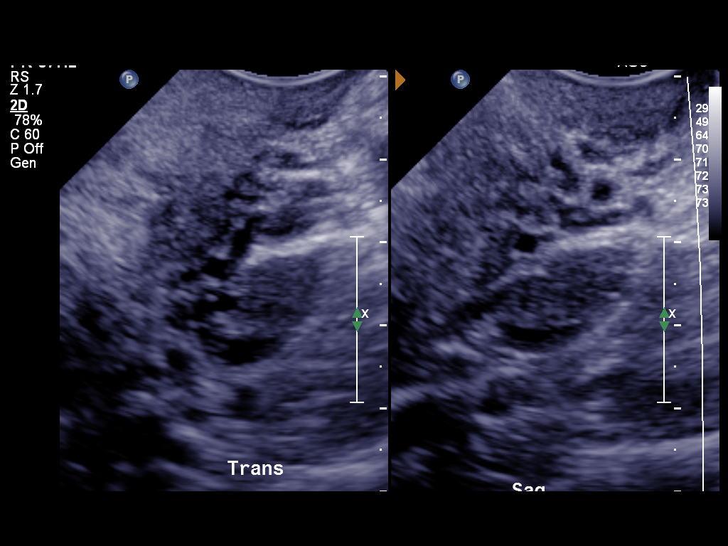
[im 67/74]
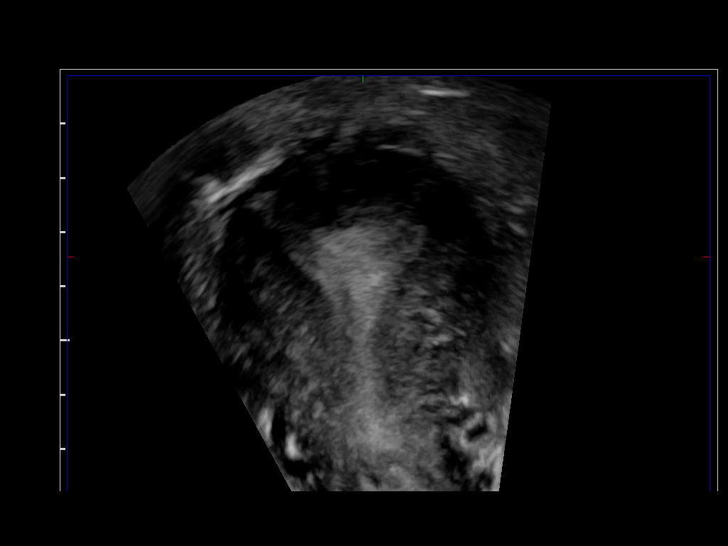
[im 74/74]
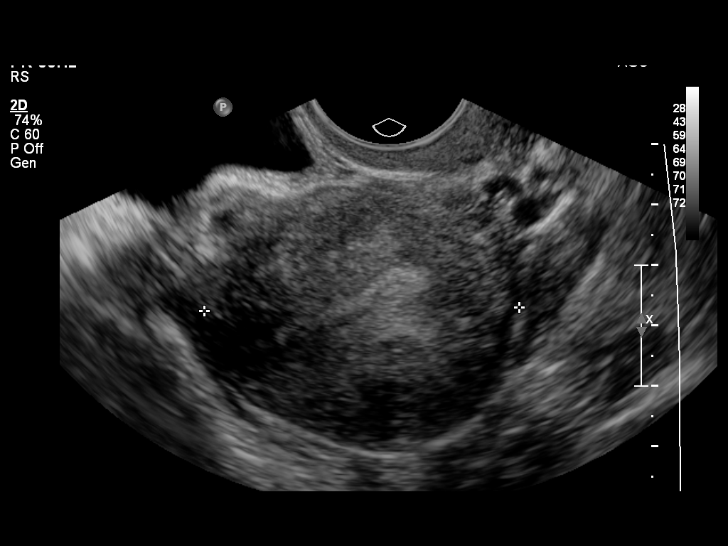

[13 of 25 positions shown; findings below may reference images not displayed]

FINDINGS: Uterus

Measurements: 8.3 x 4.9 x 5.2 cm. Multiple uterine fibroids are
demonstrated. There is a 1.8 x 2.6 x 1.8 cm subserosal fibroid along
the anterior uterine fundus. There is a 2.4 x 1.8 x 2.2 cm
intramural fibroid within the posterior uterine body. There is a
x 2.6 x 2.7 cm intramural fibroid within the posterior uterine body.

Endometrium

Thickness: 4 mm.  No focal abnormality visualized.

Right ovary

Measurements: 3.2 x 2.7 x 3.0 cm. There is a 2.7 cm simple cyst
within the right ovary.

Left ovary

Measurements: 2.0 x 1.1 x 1.5 cm. Normal appearance/no adnexal mass.

Other findings

Trace free fluid in the pelvis.
IMPRESSION: Fibroid uterus.

Trace free fluid in the pelvis.

## 2022-12-15 ENCOUNTER — Emergency Department (HOSPITAL_COMMUNITY): Payer: BC Managed Care – PPO

## 2022-12-15 ENCOUNTER — Other Ambulatory Visit: Payer: Self-pay

## 2022-12-15 ENCOUNTER — Encounter (HOSPITAL_COMMUNITY): Payer: Self-pay

## 2022-12-15 ENCOUNTER — Inpatient Hospital Stay (HOSPITAL_COMMUNITY)
Admission: EM | Admit: 2022-12-15 | Discharge: 2022-12-23 | DRG: 417 | Disposition: A | Discharge status: Home / Self Care | Payer: BC Managed Care – PPO | Attending: Surgery | Admitting: Surgery

## 2022-12-15 DIAGNOSIS — R1012 Left upper quadrant pain: Principal | ICD-10-CM

## 2022-12-15 DIAGNOSIS — K851 Biliary acute pancreatitis without necrosis or infection: Secondary | ICD-10-CM | POA: Diagnosis present

## 2022-12-15 DIAGNOSIS — K828 Other specified diseases of gallbladder: Secondary | ICD-10-CM | POA: Diagnosis present

## 2022-12-15 DIAGNOSIS — J189 Pneumonia, unspecified organism: Secondary | ICD-10-CM | POA: Diagnosis present

## 2022-12-15 DIAGNOSIS — E876 Hypokalemia: Secondary | ICD-10-CM | POA: Diagnosis not present

## 2022-12-15 DIAGNOSIS — Z90710 Acquired absence of both cervix and uterus: Secondary | ICD-10-CM | POA: Diagnosis not present

## 2022-12-15 DIAGNOSIS — K8000 Calculus of gallbladder with acute cholecystitis without obstruction: Secondary | ICD-10-CM | POA: Diagnosis present

## 2022-12-15 DIAGNOSIS — E860 Dehydration: Secondary | ICD-10-CM | POA: Diagnosis present

## 2022-12-15 DIAGNOSIS — Z87891 Personal history of nicotine dependence: Secondary | ICD-10-CM

## 2022-12-15 LAB — URINALYSIS, ROUTINE W REFLEX MICROSCOPIC
Bilirubin Urine: NEGATIVE
Glucose, UA: NEGATIVE mg/dL
Hgb urine dipstick: NEGATIVE
Ketones, ur: NEGATIVE mg/dL
Leukocytes,Ua: NEGATIVE
Nitrite: NEGATIVE
Protein, ur: 30 mg/dL — AB
Specific Gravity, Urine: 1.015 (ref 1.005–1.030)
pH: 7.5 (ref 5.0–8.0)

## 2022-12-15 LAB — COMPREHENSIVE METABOLIC PANEL
ALT: 55 U/L — ABNORMAL HIGH (ref 0–44)
AST: 72 U/L — ABNORMAL HIGH (ref 15–41)
Albumin: 4.1 g/dL (ref 3.5–5.0)
Alkaline Phosphatase: 54 U/L (ref 38–126)
Anion gap: 13 (ref 5–15)
BUN: <5 mg/dL — ABNORMAL LOW (ref 6–20)
CO2: 24 mmol/L (ref 22–32)
Calcium: 9.2 mg/dL (ref 8.9–10.3)
Chloride: 103 mmol/L (ref 98–111)
Creatinine, Ser: 0.72 mg/dL (ref 0.44–1.00)
GFR, Estimated: >60 mL/min (ref >60)
Glucose, Bld: 146 mg/dL — ABNORMAL HIGH (ref 70–99)
Potassium: 3.9 mmol/L (ref 3.5–5.1)
Sodium: 140 mmol/L (ref 135–145)
Total Bilirubin: 1.3 mg/dL — ABNORMAL HIGH (ref 0.3–1.2)
Total Protein: 6.7 g/dL (ref 6.5–8.1)

## 2022-12-15 LAB — CBC
HCT: 44 % (ref 36.0–46.0)
Hemoglobin: 15.5 g/dL — ABNORMAL HIGH (ref 12.0–15.0)
MCH: 39.7 pg — ABNORMAL HIGH (ref 26.0–34.0)
MCHC: 35.2 g/dL (ref 30.0–36.0)
MCV: 112.8 fL — ABNORMAL HIGH (ref 80.0–100.0)
Platelets: 190 10*3/uL (ref 150–400)
RBC: 3.9 MIL/uL (ref 3.87–5.11)
RDW: 14.2 % (ref 11.5–15.5)
WBC: 7.9 10*3/uL (ref 4.0–10.5)
nRBC: 0 % (ref 0.0–0.2)

## 2022-12-15 LAB — URINALYSIS, MICROSCOPIC (REFLEX)

## 2022-12-15 LAB — I-STAT BETA HCG BLOOD, ED (MC, WL, AP ONLY): I-stat hCG, quantitative: <5 m[IU]/mL (ref <5)

## 2022-12-15 LAB — LIPASE, BLOOD: Lipase: 359 U/L — ABNORMAL HIGH (ref 11–51)

## 2022-12-15 MED ORDER — LORAZEPAM 2 MG/ML IJ SOLN
1.0000 mg | Freq: Once | INTRAMUSCULAR | Status: AC
  Administered 2022-12-15: 1 mg via INTRAVENOUS
  Filled 2022-12-15: qty 1

## 2022-12-15 MED ORDER — SODIUM CHLORIDE 0.9 % IV BOLUS
1000.0000 mL | Freq: Once | INTRAVENOUS | Status: AC
Start: 2022-12-15 — End: 2022-12-15
  Administered 2022-12-15: 1000 mL via INTRAVENOUS

## 2022-12-15 MED ORDER — ONDANSETRON HCL 4 MG/2ML IJ SOLN
4.0000 mg | Freq: Once | INTRAMUSCULAR | Status: AC
  Administered 2022-12-15: 4 mg via INTRAVENOUS
  Filled 2022-12-15: qty 2

## 2022-12-15 MED ORDER — MORPHINE SULFATE (PF) 4 MG/ML IV SOLN
4.0000 mg | Freq: Once | INTRAVENOUS | Status: AC
  Administered 2022-12-15: 4 mg via INTRAVENOUS
  Filled 2022-12-15: qty 1

## 2022-12-15 MED ORDER — METOCLOPRAMIDE HCL 5 MG/ML IJ SOLN
10.0000 mg | Freq: Once | INTRAMUSCULAR | Status: AC
  Administered 2022-12-15: 10 mg via INTRAVENOUS
  Filled 2022-12-15: qty 2

## 2022-12-15 NOTE — ED Notes (Signed)
Patient transported to Ultrasound 

## 2022-12-15 NOTE — ED Provider Notes (Signed)
[place] EMERGENCY DEPARTMENT AT [facility] Provider Note   CSN: [id number] Arrival date & time: 12/15/22  1554     History  Chief Complaint  Patient presents with   Emesis   Abdominal Pain    Kimberly [name] is a 52 y.o. female.  52 year old female with no past medical history presents to the ED with a chief complaint of epigastric and left upper quadrant pain that is been ongoing since 11 AM today.  Patient endorses that the cramping, stabbing sensation localized to the epigastric region radiating to her right upper quadrant.  Exacerbated with oral intake.  Last oral intake was last night.  She has had about 8 episodes of nonbilious, nonbloody emesis.  She tried taking some Tums without any improvement in her symptoms.  Denies any prior history of alcohol abuse, denies any recent medication changes.  No alleviating factors. No fever, urinary symptoms, no vaginal discharge or other complaints.  No prior surgical intervention to her abdomen.   The history is provided by the patient.  Emesis Severity:  Moderate Duration:  7 hours Timing:  Constant Quality:  Unable to specify Able to tolerate:  Solids and liquids Chronicity:  New Recent urination:  Normal Associated symptoms: abdominal pain   Abdominal Pain Associated symptoms: vomiting        Home Medications Prior to Admission medications   Medication Sig Start Date End Date Taking? Authorizing Provider  docusate sodium (COLACE) 100 MG capsule Take 1 capsule (100 mg total) by mouth 2 (two) times daily as needed. 03/31/15   Anyanwu, Jethro Bastos, MD  docusate sodium (COLACE) 100 MG capsule Take 1 capsule (100 mg total) by mouth 2 (two) times daily. 03/31/15   Constant, Peggy, MD  fluticasone (CUTIVATE) 0.005 % ointment Apply 1 application topically 2 (two) times daily.    [provider]  ibuprofen (ADVIL,MOTRIN) 600 MG tablet Take 1 tablet (600 mg total) by mouth every 6 (six) hours as needed. 03/31/15    Anyanwu, Jethro Bastos, MD  ibuprofen (ADVIL,MOTRIN) 600 MG tablet Take 1 tablet (600 mg total) by mouth every 6 (six) hours as needed (mild pain). 03/31/15   Constant, Peggy, MD  megestrol (MEGACE) 20 MG tablet Take 2 tablets (40 mg total) by mouth daily. Can increase to two tablets twice a day in the event of heavy bleeding 01/22/15   Anyanwu, Jethro Bastos, MD  oxyCODONE-acetaminophen (PERCOCET/ROXICET) 5-325 MG tablet Take 1-2 tablets by mouth every 6 (six) hours as needed. 03/31/15   Anyanwu, Jethro Bastos, MD  predniSONE (DELTASONE) 5 MG tablet Take 5 mg by mouth daily with breakfast.    [provider]      Allergies    Patient has no known allergies.    Review of Systems   Review of Systems  Gastrointestinal:  Positive for abdominal pain and vomiting.    Physical Exam Updated Vital Signs BP (!) 161/95 (BP Location: Left Arm)   Pulse 82   Temp 98.2 F (36.8 C) (Oral)   Resp 18   Ht 5\' 4"  (1.626 m)   Wt 64 kg   SpO2 95%   BMI 24.22 kg/m  Physical Exam  ED Results / Procedures / Treatments   Labs (all labs ordered are listed, but only abnormal results are displayed) Labs Reviewed  LIPASE, BLOOD - Abnormal; Notable for the following components:      Result Value   Lipase 359 (*)    All other components within normal limits  COMPREHENSIVE METABOLIC  PANEL - Abnormal; Notable for the following components:   Glucose, Bld 146 (*)    BUN <5 (*)    AST 72 (*)    ALT 55 (*)    Total Bilirubin 1.3 (*)    All other components within normal limits  CBC - Abnormal; Notable for the following components:   Hemoglobin 15.5 (*)    MCV 112.8 (*)    MCH 39.7 (*)    All other components within normal limits  URINALYSIS, ROUTINE W REFLEX MICROSCOPIC - Abnormal; Notable for the following components:   Protein, ur 30 (*)    All other components within normal limits  URINALYSIS, MICROSCOPIC (REFLEX) - Abnormal; Notable for the following components:   Bacteria, UA RARE (*)    All other  components within normal limits  I-STAT BETA HCG BLOOD, ED ([facility], [facility], [facility] ONLY)    EKG EKG Interpretation  Date/Time:  Thursday December 15 2022 16:01:08 EDT Ventricular Rate:  85 PR Interval:  132 QRS Duration: 74 QT Interval:  378 QTC Calculation: 449 R Axis:   62 Text Interpretation: Normal sinus rhythm Normal ECG No previous ECGs available Confirmed by Gwyneth Sprout ([id number]) on 12/15/2022 6:06:31 PM  Radiology US Abdomen Limited RUQ (LIVER/GB)  Result Date: 12/15/2022 CLINICAL DATA:  Right upper quadrant pain EXAM: ULTRASOUND ABDOMEN LIMITED RIGHT UPPER QUADRANT COMPARISON:  None Available. FINDINGS: Gallbladder: Gallbladder is well distended with a stone in the gallbladder neck. No wall thickening or pericholecystic fluid is noted. Negative sonographic Murphy's sign is elicited. Common bile duct: Diameter: 4 mm. Liver: Increase in echogenicity consistent with fatty infiltration. Portal vein is patent on color Doppler imaging with normal direction of blood flow towards the liver. Other: None. IMPRESSION: Fatty liver. Cholelithiasis without complicating factors. Electronically Signed   By: Alcide Clever M.D.   On: 12/15/2022 19:48    Procedures Procedures    Medications Ordered in ED Medications  morphine (PF) 4 MG/ML injection 4 mg (4 mg Intravenous Given 12/15/22 1835)  sodium chloride 0.9 % bolus 1,000 mL (0 mLs Intravenous Stopped 12/15/22 2028)  ondansetron (ZOFRAN) injection 4 mg (4 mg Intravenous Given 12/15/22 1835)  morphine (PF) 4 MG/ML injection 4 mg (4 mg Intravenous Given 12/15/22 1959)  metoCLOPramide (REGLAN) injection 10 mg (10 mg Intravenous Given 12/15/22 2003)    ED Course/ Medical Decision Making/ A&P Clinical Course as of 12/15/22 2041  Thu Dec 15, 2022  2010 AST(!): 72 [JS]  2010 ALT(!): 55 [JS]  2010 Lipase(!): 359 [JS]    Clinical Course User Index [JS] Claude Manges, PA-C                             Medical Decision Making Amount and/or Complexity of  Data Reviewed Labs: ordered. Decision-making details documented in ED Course. Radiology: ordered.  Risk Prescription drug management.   This patient presents to the ED for concern of epigastric abdominal pain, this involves a number of treatment options, and is a complaint that carries with it a high risk of complications and morbidity.  The differential diagnosis includes cholecystitis, pancreatitis, versus reflux.   Co morbidities: Discussed in HPI   Brief History:  See HPI.  EMR reviewed including pt PMHx, past surgical history and past visits to ER.   See HPI for more details   Lab Tests:  I ordered and independently interpreted labs.  The pertinent results include:    I personally reviewed all laboratory work and  imaging. Metabolic panel without any acute abnormality specifically kidney function within normal limits and no significant electrolyte abnormalities. CBC without leukocytosis or significant anemia.   Imaging Studies:  Ultrasound RUQ showed: Fatty liver.    Cholelithiasis without complicating factors.    CT Abdomen and pelvis showed:   Cardiac Monitoring:  The patient was maintained on a cardiac monitor.  I personally viewed and interpreted the cardiac monitored which showed an underlying rhythm of: NSR 85 EKG non-ischemic   Medicines ordered:  I ordered medication including morphine, reglan, zofran  for symptomatic treatment Reevaluation of the patient after these medicines showed that the patient stayed the same I have reviewed the patients home medicines and have made adjustments as needed  Consults:  I requested consultation with Dr. Cliffton Asters,  and discussed lab and imaging findings as well as pertinent plan - they recommend: admission for surgical intervention in the morning.   Reevaluation:  After the interventions noted above I re-evaluated patient and found that they have :stayed the same   Social Determinants of Health:  The  patient's social determinants of health were a factor in the care of this patient   Problem List / ED Course:  Patient with no pertinent past medical history presents to the ED with a chief complaint of left upper quadrant pain which began this morning of sudden onset.  Describing as a sharp stabbing sensation radiating to her right upper quadrant along with epigastric region.  Did try to take some Tums without any improvement in symptoms.  Proceeded to have multiple episodes of nausea, nonbilious emesis.  Arrived to the ED with stable vital signs, evaluated by me with CBC with no leukocytosis, hemoglobin slightly elevated.  CMP without any electrolyte derangement, creatinine levels unremarkable.  LFTs are elevated AST 72, ALT is 55, no prior history of alcohol abuse, lipase level is elevated.  No prior history of gallstones.  UA is negative on today's visit.  Ultrasound obtained showed cholelithiasis along with a distended gallbladder neck. Patient had multiple rounds of morphine, Reglan, Zofran to help with symptomatic treatment.  I spoke to Dr. Cliffton Asters general surgery who will evaluate patient in the emergency department and further admit for likely cholecystectomy tomorrow morning.  Dispostion:  After consideration of the diagnostic results and the patients response to treatment, I feel that the patent would benefit from admission via general surgery.     Portions of this note were generated with Scientist, clinical (histocompatibility and immunogenetics). Dictation errors may occur despite best attempts at proofreading.   Final Clinical Impression(s) / ED Diagnoses Final diagnoses:  Left upper quadrant abdominal pain  Calculus of gallbladder with acute cholecystitis without obstruction    Rx / DC Orders ED Discharge Orders     None         Claude Manges, Cordelia Poche 12/15/22 2041    Gwyneth Sprout, MD 12/15/22 [phone]

## 2022-12-15 NOTE — ED Notes (Signed)
ED TO INPATIENT HANDOFF REPORT  ED Nurse Name and Phone #: Rodney Booze [phone]  S Name/Age/Gender Kimberly [name] 52 y.o. female Room/Bed: H021C/H021C  Code Status   Code Status: Prior  Home/SNF/Other Home Patient oriented to: self, place, time, and situation Is this baseline? Yes   Triage Complete: Triage complete  Chief Complaint Acute biliary pancreatitis [K85.10]  Triage Note Pt c/o N/V, LUQ abdominal pain started today. Pt has vomited 8 times today   Allergies No Known Allergies  Level of Care/Admitting Diagnosis ED Disposition     ED Disposition  Admit   Condition  --   Comment  Hospital Area: [facility] [[id number]]  Level of Care: Med-Surg [16]  May admit patient to [facility] or [facility] if equivalent level of care is available:: No  Covid Evaluation: Asymptomatic - no recent exposure (last 10 days) testing not required  Diagnosis: Acute biliary pancreatitis [098119]  Admitting Physician: CCS, MD [[id number]]  Attending Physician: CCS, MD [[id number]]  Bed request comments: 6N  Certification:: I certify this patient will need inpatient services for at least 2 midnights  Estimated Length of Stay: 2          B Medical/Surgery History Past Medical History:  Diagnosis Date   Eczema    Past Surgical History:  Procedure Laterality Date   CRYOTHERAPY     DILATION AND CURETTAGE OF UTERUS     VAGINAL HYSTERECTOMY N/A 03/31/2015   Procedure: HYSTERECTOMY VAGINAL;  Surgeon: Tereso Newcomer, MD;  Location: [facility];  Service: Gynecology;  Laterality: N/A;     A IV Location/Drains/Wounds Patient Lines/Drains/Airways Status     Active Line/Drains/Airways     Name Placement date Placement time Site Days   Peripheral IV 12/15/22 20 G Right Antecubital 12/15/22  1834  Antecubital  less than 1   Incision (Closed) 03/31/15 Vagina Other (Comment) 03/31/15  1600  -- 2816            Intake/Output Last 24 hours  Intake/Output Summary (Last 24 hours)  at 12/15/2022 2317 Last data filed at 12/15/2022 2028 Gross per 24 hour  Intake 1000 ml  Output --  Net 1000 ml    Labs/Imaging Results for orders placed or performed during the hospital encounter of 12/15/22 (from the past 48 hour(s))  Urinalysis, Routine w reflex microscopic -Urine, Clean Catch     Status: Abnormal   Collection Time: 12/15/22  4:25 PM  Result Value Ref Range   Color, Urine YELLOW YELLOW   APPearance CLEAR CLEAR   Specific Gravity, Urine 1.015 1.005 - 1.030   pH 7.5 5.0 - 8.0   Glucose, UA NEGATIVE NEGATIVE mg/dL   Hgb urine dipstick NEGATIVE NEGATIVE   Bilirubin Urine NEGATIVE NEGATIVE   Ketones, ur NEGATIVE NEGATIVE mg/dL   Protein, ur 30 (A) NEGATIVE mg/dL   Nitrite NEGATIVE NEGATIVE   Leukocytes,Ua NEGATIVE NEGATIVE    Comment: Performed at [facility], [address], [place], [place] [address]  Urinalysis, Microscopic (reflex)     Status: Abnormal   Collection Time: 12/15/22  4:25 PM  Result Value Ref Range   RBC / HPF 0-5 0 - 5 RBC/hpf   WBC, UA 0-5 0 - 5 WBC/hpf   Bacteria, UA RARE (A) NONE SEEN   Squamous Epithelial / HPF 0-5 0 - 5 /HPF   Mucus PRESENT     Comment: Performed at [facility], [address], [place], [place] [address]  Lipase, blood  Status: Abnormal   Collection Time: 12/15/22  4:31 PM  Result Value Ref Range   Lipase 359 (H) 11 - 51 U/L    Comment: Performed at [facility], [address] [place], [place] [address]  Comprehensive metabolic panel     Status: Abnormal   Collection Time: 12/15/22  4:31 PM  Result Value Ref Range   Sodium 140 135 - 145 mmol/L   Potassium 3.9 3.5 - 5.1 mmol/L   Chloride 103 98 - 111 mmol/L   CO2 24 22 - 32 mmol/L   Glucose, Bld 146 (H) 70 - 99 mg/dL    Comment: Glucose reference range applies only to samples taken after fasting for at least 8 hours.   BUN <5 (L) 6 - 20 mg/dL   Creatinine, Ser 2.13 0.44 - 1.00 mg/dL   Calcium 9.2 8.9 - 08.6 mg/dL   Total Protein 6.7 6.5 -  8.1 g/dL   Albumin 4.1 3.5 - 5.0 g/dL   AST 72 (H) 15 - 41 U/L   ALT 55 (H) 0 - 44 U/L   Alkaline Phosphatase 54 38 - 126 U/L   Total Bilirubin 1.3 (H) 0.3 - 1.2 mg/dL   GFR, Estimated >57 >84 mL/min    Comment: (NOTE) Calculated using the CKD-EPI Creatinine Equation (2021)    Anion gap 13 5 - 15    Comment: Performed at [facility], [address] [place], [place] [address]  CBC     Status: Abnormal   Collection Time: 12/15/22  4:31 PM  Result Value Ref Range   WBC 7.9 4.0 - 10.5 K/uL   RBC 3.90 3.87 - 5.11 MIL/uL   Hemoglobin 15.5 (H) 12.0 - 15.0 g/dL   HCT 52.8 41.3 - 24.4 %   MCV 112.8 (H) 80.0 - 100.0 fL   MCH 39.7 (H) 26.0 - 34.0 pg   MCHC 35.2 30.0 - 36.0 g/dL   RDW 01.0 27.2 - 53.6 %   Platelets 190 150 - 400 K/uL   nRBC 0.0 0.0 - 0.2 %    Comment: Performed at [facility], [address] [place], [place] [address]  I-Stat beta hCG blood, ED     Status: None   Collection Time: 12/15/22  4:42 PM  Result Value Ref Range   I-stat hCG, quantitative <5.0 <5 mIU/mL   Comment 3            Comment:   GEST. AGE      CONC.  (mIU/mL)   <=1 WEEK        5 - 50     2 WEEKS       50 - 500     3 WEEKS       100 - 10,000     4 WEEKS     1,000 - 30,000        FEMALE AND NON-PREGNANT FEMALE:     LESS THAN 5 mIU/mL    US Abdomen Limited RUQ (LIVER/GB)  Result Date: 12/15/2022 CLINICAL DATA:  Right upper quadrant pain EXAM: ULTRASOUND ABDOMEN LIMITED RIGHT UPPER QUADRANT COMPARISON:  None Available. FINDINGS: Gallbladder: Gallbladder is well distended with a stone in the gallbladder neck. No wall thickening or pericholecystic fluid is noted. Negative sonographic Murphy's sign is elicited. Common bile duct: Diameter: 4 mm. Liver: Increase in echogenicity consistent with fatty infiltration. Portal vein is patent on color Doppler imaging with normal direction of blood flow towards the liver. Other: None. IMPRESSION:  Fatty liver. Cholelithiasis without complicating factors.  Electronically Signed   By: Alcide Clever M.D.   On: 12/15/2022 19:48    Pending Labs [employer] (From admission, onward)     Start     Ordered   Signed and Held  HIV Antibody (routine testing w rflx)  (HIV Antibody (Routine testing w reflex) panel)  Once,   R        Signed and Held   Signed and Held  Comprehensive metabolic panel  Tomorrow morning,   R        Signed and Held   Signed and Held  Lipase, blood  Tomorrow morning,   R        Signed and Held            Vitals/Pain Today's Vitals   12/15/22 1923 12/15/22 1956 12/15/22 2030 12/15/22 2031  BP:  (!) 161/95    Pulse:  82    Resp:  18    Temp:  98.2 F (36.8 C)    TempSrc:  Oral    SpO2:  95%    Weight:      Height:      PainSc: 5   6  6      Isolation Precautions No active isolations  Medications Medications  morphine (PF) 4 MG/ML injection 4 mg (4 mg Intravenous Given 12/15/22 1835)  sodium chloride 0.9 % bolus 1,000 mL (0 mLs Intravenous Stopped 12/15/22 2028)  ondansetron (ZOFRAN) injection 4 mg (4 mg Intravenous Given 12/15/22 1835)  morphine (PF) 4 MG/ML injection 4 mg (4 mg Intravenous Given 12/15/22 1959)  metoCLOPramide (REGLAN) injection 10 mg (10 mg Intravenous Given 12/15/22 2003)  LORazepam (ATIVAN) injection 1 mg (1 mg Intravenous Given 12/15/22 2141)    Mobility walks     Focused Assessments     R Recommendations: See Admitting Provider Note  Report given to:   Additional Notes:

## 2022-12-15 NOTE — ED Triage Notes (Signed)
Pt c/o N/V, LUQ abdominal pain started today. Pt has vomited 8 times today

## 2022-12-15 NOTE — H&P (Signed)
CC: MEG pain  HPI: Kimberly [name] is an 52 y.o. female with no known medical hx presented to [facility] ED with acute onset midepigastric pain that began this morning.  Began suddenly, around 11 AM.  Pain has been described as sharp/stabbing.  Radiates into the right upper quadrant.  Pain made worse with attempted p.o. intake.  Some associated nausea and vomiting x 1.  Has tried to take Tums but that did not seem to help.  Never had this kind of pain before.  PSH: Vaginal hysterectomy 03/2015  Social: Denies use of tobacco; reports social EtOH use. Not currently working; previously has worked as Agricultural engineer in [employer]. Her husband is at bedside in ER.  Past Medical History:  Diagnosis Date   Eczema     Past Surgical History:  Procedure Laterality Date   CRYOTHERAPY     DILATION AND CURETTAGE OF UTERUS     VAGINAL HYSTERECTOMY N/A 03/31/2015   Procedure: HYSTERECTOMY VAGINAL;  Surgeon: Tereso Newcomer, MD;  Location: [facility];  Service: Gynecology;  Laterality: N/A;    History reviewed. No pertinent family history.  Social:  reports that she has quit smoking. Her smoking use included cigarettes. She has never used smokeless tobacco. She reports that she does not drink alcohol and does not use drugs.  Allergies: No Known Allergies  Medications: I have reviewed the patient's current medications.  Results for orders placed or performed during the hospital encounter of 12/15/22 (from the past 48 hour(s))  Urinalysis, Routine w reflex microscopic -Urine, Clean Catch     Status: Abnormal   Collection Time: 12/15/22  4:25 PM  Result Value Ref Range   Color, Urine YELLOW YELLOW   APPearance CLEAR CLEAR   Specific Gravity, Urine 1.015 1.005 - 1.030   pH 7.5 5.0 - 8.0   Glucose, UA NEGATIVE NEGATIVE mg/dL   Hgb urine dipstick NEGATIVE NEGATIVE   Bilirubin Urine NEGATIVE NEGATIVE   Ketones, ur NEGATIVE NEGATIVE mg/dL   Protein, ur 30 (A) NEGATIVE mg/dL   Nitrite NEGATIVE NEGATIVE    Leukocytes,Ua NEGATIVE NEGATIVE    Comment: Performed at [facility], [address] [place], [place] [address]  Urinalysis, Microscopic (reflex)     Status: Abnormal   Collection Time: 12/15/22  4:25 PM  Result Value Ref Range   RBC / HPF 0-5 0 - 5 RBC/hpf   WBC, UA 0-5 0 - 5 WBC/hpf   Bacteria, UA RARE (A) NONE SEEN   Squamous Epithelial / HPF 0-5 0 - 5 /HPF   Mucus PRESENT     Comment: Performed at [facility], [address], [place], [place] [address]  Lipase, blood     Status: Abnormal   Collection Time: 12/15/22  4:31 PM  Result Value Ref Range   Lipase 359 (H) 11 - 51 U/L    Comment: Performed at [facility], [address], [place], [place] [address]  Comprehensive metabolic panel     Status: Abnormal   Collection Time: 12/15/22  4:31 PM  Result Value Ref Range   Sodium 140 135 - 145 mmol/L   Potassium 3.9 3.5 - 5.1 mmol/L   Chloride 103 98 - 111 mmol/L   CO2 24 22 - 32 mmol/L   Glucose, Bld 146 (H) 70 - 99 mg/dL    Comment: Glucose reference range applies only to samples taken after fasting for at least 8 hours.   BUN <5 (L) 6 - 20 mg/dL  Creatinine, Ser 0.72 0.44 - 1.00 mg/dL   Calcium 9.2 8.9 - 40.9 mg/dL   Total Protein 6.7 6.5 - 8.1 g/dL   Albumin 4.1 3.5 - 5.0 g/dL   AST 72 (H) 15 - 41 U/L   ALT 55 (H) 0 - 44 U/L   Alkaline Phosphatase 54 38 - 126 U/L   Total Bilirubin 1.3 (H) 0.3 - 1.2 mg/dL   GFR, Estimated >81 >19 mL/min    Comment: (NOTE) Calculated using the CKD-EPI Creatinine Equation (2021)    Anion gap 13 5 - 15    Comment: Performed at [facility], [address], [place], [place] [address]  CBC     Status: Abnormal   Collection Time: 12/15/22  4:31 PM  Result Value Ref Range   WBC 7.9 4.0 - 10.5 K/uL   RBC 3.90 3.87 - 5.11 MIL/uL   Hemoglobin 15.5 (H) 12.0 - 15.0 g/dL   HCT 95.6 21.3 - 08.6 %   MCV 112.8 (H) 80.0 - 100.0 fL   MCH 39.7 (H) 26.0 - 34.0 pg   MCHC 35.2 30.0 - 36.0 g/dL   RDW 57.8 46.9 - 62.9 %    Platelets 190 150 - 400 K/uL   nRBC 0.0 0.0 - 0.2 %    Comment: Performed at [facility], [address], [place], [place] [address]  I-Stat beta hCG blood, ED     Status: None   Collection Time: 12/15/22  4:42 PM  Result Value Ref Range   I-stat hCG, quantitative <5.0 <5 mIU/mL   Comment 3            Comment:   GEST. AGE      CONC.  (mIU/mL)   <=1 WEEK        5 - 50     2 WEEKS       50 - 500     3 WEEKS       100 - 10,000     4 WEEKS     1,000 - 30,000        FEMALE AND NON-PREGNANT FEMALE:     LESS THAN 5 mIU/mL     US Abdomen Limited RUQ (LIVER/GB)  Result Date: 12/15/2022 CLINICAL DATA:  Right upper quadrant pain EXAM: ULTRASOUND ABDOMEN LIMITED RIGHT UPPER QUADRANT COMPARISON:  None Available. FINDINGS: Gallbladder: Gallbladder is well distended with a stone in the gallbladder neck. No wall thickening or pericholecystic fluid is noted. Negative sonographic Murphy's sign is elicited. Common bile duct: Diameter: 4 mm. Liver: Increase in echogenicity consistent with fatty infiltration. Portal vein is patent on color Doppler imaging with normal direction of blood flow towards the liver. Other: None. IMPRESSION: Fatty liver. Cholelithiasis without complicating factors. Electronically Signed   By: Alcide Clever M.D.   On: 12/15/2022 19:48    ROS - all of the below systems have been reviewed with the patient and positives are indicated with bold text General: chills, fever or night sweats Eyes: blurry vision or double vision ENT: epistaxis or sore throat Allergy/Immunology: itchy/watery eyes or nasal congestion Hematologic/Lymphatic: bleeding problems, blood clots or swollen lymph nodes Endocrine: temperature intolerance or unexpected weight changes Breast: new or changing breast lumps or nipple discharge Resp: cough, shortness of breath, or wheezing CV: chest pain or dyspnea on exertion GI: as per HPI GU: dysuria, trouble voiding, or hematuria MSK: joint pain or joint  stiffness Neuro: TIA or stroke symptoms Derm: pruritus and skin lesion changes Psych: anxiety  and depression  PE Blood pressure (!) 161/95, pulse 82, temperature 98.2 F (36.8 C), temperature source Oral, resp. rate 18, height 5\' 4"  (1.626 m), weight 64 kg, SpO2 95 %. Constitutional: NAD; conversant Eyes: Moist conjunctiva Lungs: Normal respiratory effort CV: RRR GI: Abd soft, TTP in MEG and RUQ; no rebound nor guarding Psychiatric: Appropriate affect  Results for orders placed or performed during the hospital encounter of 12/15/22 (from the past 48 hour(s))  Urinalysis, Routine w reflex microscopic -Urine, Clean Catch     Status: Abnormal   Collection Time: 12/15/22  4:25 PM  Result Value Ref Range   Color, Urine YELLOW YELLOW   APPearance CLEAR CLEAR   Specific Gravity, Urine 1.015 1.005 - 1.030   pH 7.5 5.0 - 8.0   Glucose, UA NEGATIVE NEGATIVE mg/dL   Hgb urine dipstick NEGATIVE NEGATIVE   Bilirubin Urine NEGATIVE NEGATIVE   Ketones, ur NEGATIVE NEGATIVE mg/dL   Protein, ur 30 (A) NEGATIVE mg/dL   Nitrite NEGATIVE NEGATIVE   Leukocytes,Ua NEGATIVE NEGATIVE    Comment: Performed at [facility], [address], [place], [place] [address]  Urinalysis, Microscopic (reflex)     Status: Abnormal   Collection Time: 12/15/22  4:25 PM  Result Value Ref Range   RBC / HPF 0-5 0 - 5 RBC/hpf   WBC, UA 0-5 0 - 5 WBC/hpf   Bacteria, UA RARE (A) NONE SEEN   Squamous Epithelial / HPF 0-5 0 - 5 /HPF   Mucus PRESENT     Comment: Performed at [facility], [address], [place], [place] [address]  Lipase, blood     Status: Abnormal   Collection Time: 12/15/22  4:31 PM  Result Value Ref Range   Lipase 359 (H) 11 - 51 U/L    Comment: Performed at [facility], [address] [place], [place] [address]  Comprehensive metabolic panel     Status: Abnormal   Collection Time: 12/15/22  4:31 PM  Result Value Ref Range   Sodium 140 135 - 145 mmol/L   Potassium 3.9 3.5 -  5.1 mmol/L   Chloride 103 98 - 111 mmol/L   CO2 24 22 - 32 mmol/L   Glucose, Bld 146 (H) 70 - 99 mg/dL    Comment: Glucose reference range applies only to samples taken after fasting for at least 8 hours.   BUN <5 (L) 6 - 20 mg/dL   Creatinine, Ser 2.44 0.44 - 1.00 mg/dL   Calcium 9.2 8.9 - 01.0 mg/dL   Total Protein 6.7 6.5 - 8.1 g/dL   Albumin 4.1 3.5 - 5.0 g/dL   AST 72 (H) 15 - 41 U/L   ALT 55 (H) 0 - 44 U/L   Alkaline Phosphatase 54 38 - 126 U/L   Total Bilirubin 1.3 (H) 0.3 - 1.2 mg/dL   GFR, Estimated >27 >25 mL/min    Comment: (NOTE) Calculated using the CKD-EPI Creatinine Equation (2021)    Anion gap 13 5 - 15    Comment: Performed at [facility], [address], [place], [place] [address]  CBC     Status: Abnormal   Collection Time: 12/15/22  4:31 PM  Result Value Ref Range   WBC 7.9 4.0 - 10.5 K/uL   RBC 3.90 3.87 - 5.11 MIL/uL   Hemoglobin 15.5 (H) 12.0 - 15.0 g/dL   HCT 03.4 74.2 - 59.5 %   MCV 112.8 (H) 80.0 - 100.0 fL  MCH 39.7 (H) 26.0 - 34.0 pg   MCHC 35.2 30.0 - 36.0 g/dL   RDW 16.1 09.6 - 04.5 %   Platelets 190 150 - 400 K/uL   nRBC 0.0 0.0 - 0.2 %    Comment: Performed at [facility], [address] [place], [place] [address]  I-Stat beta hCG blood, ED     Status: None   Collection Time: 12/15/22  4:42 PM  Result Value Ref Range   I-stat hCG, quantitative <5.0 <5 mIU/mL   Comment 3            Comment:   GEST. AGE      CONC.  (mIU/mL)   <=1 WEEK        5 - 50     2 WEEKS       50 - 500     3 WEEKS       100 - 10,000     4 WEEKS     1,000 - 30,000        FEMALE AND NON-PREGNANT FEMALE:     LESS THAN 5 mIU/mL     US Abdomen Limited RUQ (LIVER/GB)  Result Date: 12/15/2022 CLINICAL DATA:  Right upper quadrant pain EXAM: ULTRASOUND ABDOMEN LIMITED RIGHT UPPER QUADRANT COMPARISON:  None Available. FINDINGS: Gallbladder: Gallbladder is well distended with a stone in the gallbladder neck. No wall thickening or pericholecystic fluid is  noted. Negative sonographic Murphy's sign is elicited. Common bile duct: Diameter: 4 mm. Liver: Increase in echogenicity consistent with fatty infiltration. Portal vein is patent on color Doppler imaging with normal direction of blood flow towards the liver. Other: None. IMPRESSION: Fatty liver. Cholelithiasis without complicating factors. Electronically Signed   By: Alcide Clever M.D.   On: 12/15/2022 19:48    A/P: Kimberly [name] is an 52 y.o. female with no reported health history, here with apparent biliary pancreatitis  -Admit to ward -NPO given active nausea and emesis -LR @ 125cc/hr -Trend LFTs/lipase --The anatomy and physiology of the hepatobiliary system was discussed with her. The pathophysiology of gallbladder disease was then reviewed as well. We reviewed etiology of gallstone pancreatitis -We reviewed treatment plan above. We discussed potential for cholecystectomy prior to discharge due to the increased risk of recurrent pancreatitis/cholecystitis/choledocholithiasis in the short-term.  We discussed timing of surgery largely based upon her clinical progress-generally waiting until her pain is improving and her lipase is trending in the appropriate direction. -We spent time discussing what a laparoscopic cholecystectomy typically entails. - We discussed that she would be seen by our acute care surgical service in the day(s) ahead and timing of surgery on a day-to-day basis.  We discussed that she would meet the surgeon that would be doing her procedure prior - Dr. Sheliah Hatch being on for our practice tomorrow.  I spent a total of 75 minutes in both face-to-face and non-face-to-face activities, excluding procedures performed, for this visit on the date of this encounter.  Marin Olp, MD [facility], [facility]

## 2022-12-16 ENCOUNTER — Inpatient Hospital Stay (HOSPITAL_COMMUNITY): Payer: BC Managed Care – PPO

## 2022-12-16 LAB — COMPREHENSIVE METABOLIC PANEL
ALT: 48 U/L — ABNORMAL HIGH (ref 0–44)
AST: 57 U/L — ABNORMAL HIGH (ref 15–41)
Albumin: 3.8 g/dL (ref 3.5–5.0)
Alkaline Phosphatase: 56 U/L (ref 38–126)
Anion gap: 14 (ref 5–15)
BUN: <5 mg/dL — ABNORMAL LOW (ref 6–20)
CO2: 21 mmol/L — ABNORMAL LOW (ref 22–32)
Calcium: 8.8 mg/dL — ABNORMAL LOW (ref 8.9–10.3)
Chloride: 102 mmol/L (ref 98–111)
Creatinine, Ser: 0.65 mg/dL (ref 0.44–1.00)
GFR, Estimated: >60 mL/min (ref >60)
Glucose, Bld: 147 mg/dL — ABNORMAL HIGH (ref 70–99)
Potassium: 3.8 mmol/L (ref 3.5–5.1)
Sodium: 137 mmol/L (ref 135–145)
Total Bilirubin: 2.1 mg/dL — ABNORMAL HIGH (ref 0.3–1.2)
Total Protein: 6 g/dL — ABNORMAL LOW (ref 6.5–8.1)

## 2022-12-16 LAB — LIPASE, BLOOD: Lipase: 469 U/L — ABNORMAL HIGH (ref 11–51)

## 2022-12-16 LAB — HIV ANTIBODY (ROUTINE TESTING W REFLEX): HIV Screen 4th Generation wRfx: NONREACTIVE

## 2022-12-16 MED ORDER — HEPARIN SODIUM (PORCINE) 5000 UNIT/ML IJ SOLN
5000.0000 [IU] | Freq: Three times a day (TID) | INTRAMUSCULAR | Status: DC
  Administered 2022-12-16 – 2022-12-23 (×19): 5000 [IU] via SUBCUTANEOUS
  Filled 2022-12-16 (×19): qty 1

## 2022-12-16 MED ORDER — PREDNISONE 5 MG PO TABS: 5.0000 mg | ORAL_TABLET | Freq: Every day | ORAL | Status: DC

## 2022-12-16 MED ORDER — ONDANSETRON HCL 4 MG/2ML IJ SOLN
4.0000 mg | Freq: Four times a day (QID) | INTRAMUSCULAR | Status: DC | PRN
  Administered 2022-12-16 – 2022-12-20 (×5): 4 mg via INTRAVENOUS
  Filled 2022-12-16 (×6): qty 2

## 2022-12-16 MED ORDER — MELATONIN 3 MG PO TABS: 3.0000 mg | ORAL_TABLET | Freq: Every evening | ORAL | Status: DC | PRN

## 2022-12-16 MED ORDER — LACTATED RINGERS IV SOLN: INTRAVENOUS | Status: DC

## 2022-12-16 MED ORDER — ACETAMINOPHEN 500 MG PO TABS
1000.0000 mg | ORAL_TABLET | Freq: Four times a day (QID) | ORAL | Status: DC
  Administered 2022-12-16 – 2022-12-23 (×8): 1000 mg via ORAL
  Filled 2022-12-16 (×14): qty 2

## 2022-12-16 MED ORDER — OXYCODONE HCL 5 MG PO TABS
5.0000 mg | ORAL_TABLET | ORAL | Status: DC | PRN
  Administered 2022-12-17 – 2022-12-21 (×2): 10 mg via ORAL
  Administered 2022-12-22: 5 mg via ORAL
  Filled 2022-12-16: qty 1
  Filled 2022-12-16 (×2): qty 2

## 2022-12-16 MED ORDER — PROCHLORPERAZINE EDISYLATE 10 MG/2ML IJ SOLN: 5.0000 mg | Freq: Four times a day (QID) | INTRAMUSCULAR | Status: DC | PRN

## 2022-12-16 MED ORDER — LACTATED RINGERS IV BOLUS
1000.0000 mL | Freq: Once | INTRAVENOUS | Status: AC
  Administered 2022-12-16: 1000 mL via INTRAVENOUS

## 2022-12-16 MED ORDER — GADOBUTROL 1 MMOL/ML IV SOLN
6.0000 mL | Freq: Once | INTRAVENOUS | Status: AC | PRN
  Administered 2022-12-16: 6 mL via INTRAVENOUS

## 2022-12-16 MED ORDER — SIMETHICONE 80 MG PO CHEW
40.0000 mg | CHEWABLE_TABLET | Freq: Four times a day (QID) | ORAL | Status: DC | PRN
  Administered 2022-12-20: 40 mg via ORAL
  Filled 2022-12-16: qty 1

## 2022-12-16 MED ORDER — ONDANSETRON 4 MG PO TBDP: 4.0000 mg | ORAL_TABLET | Freq: Four times a day (QID) | ORAL | Status: DC | PRN

## 2022-12-16 MED ORDER — PANTOPRAZOLE SODIUM 40 MG PO TBEC
40.0000 mg | DELAYED_RELEASE_TABLET | Freq: Every day | ORAL | Status: DC
  Filled 2022-12-16: qty 1

## 2022-12-16 MED ORDER — HYDRALAZINE HCL 20 MG/ML IJ SOLN
10.0000 mg | INTRAMUSCULAR | Status: DC | PRN
  Administered 2022-12-16: 10 mg via INTRAVENOUS
  Filled 2022-12-16 (×2): qty 1

## 2022-12-16 MED ORDER — DOCUSATE SODIUM 100 MG PO CAPS
200.0000 mg | ORAL_CAPSULE | Freq: Two times a day (BID) | ORAL | Status: DC | PRN
  Filled 2022-12-16: qty 2

## 2022-12-16 MED ORDER — SODIUM CHLORIDE 0.9 % IV BOLUS
1000.0000 mL | Freq: Once | INTRAVENOUS | Status: AC
  Administered 2022-12-16: 1000 mL via INTRAVENOUS

## 2022-12-16 MED ORDER — HYDROMORPHONE HCL 1 MG/ML IJ SOLN
0.5000 mg | INTRAMUSCULAR | Status: DC | PRN
  Administered 2022-12-16 (×3): 0.5 mg via INTRAVENOUS
  Filled 2022-12-16 (×3): qty 0.5

## 2022-12-16 MED ORDER — DIPHENHYDRAMINE HCL 12.5 MG/5ML PO ELIX: 12.5000 mg | ORAL_SOLUTION | Freq: Four times a day (QID) | ORAL | Status: DC | PRN

## 2022-12-16 MED ORDER — HYDROMORPHONE HCL 1 MG/ML IJ SOLN
0.5000 mg | INTRAMUSCULAR | Status: DC | PRN
  Administered 2022-12-16 – 2022-12-19 (×7): 1 mg via INTRAVENOUS
  Filled 2022-12-16 (×7): qty 1

## 2022-12-16 MED ORDER — DIPHENHYDRAMINE HCL 50 MG/ML IJ SOLN: 12.5000 mg | Freq: Four times a day (QID) | INTRAMUSCULAR | Status: DC | PRN

## 2022-12-16 MED ORDER — PANTOPRAZOLE SODIUM 40 MG IV SOLR
40.0000 mg | INTRAVENOUS | Status: DC
  Administered 2022-12-16 – 2022-12-22 (×6): 40 mg via INTRAVENOUS
  Filled 2022-12-16 (×6): qty 10

## 2022-12-16 MED ORDER — IBUPROFEN 600 MG PO TABS: 600.0000 mg | ORAL_TABLET | Freq: Four times a day (QID) | ORAL | Status: DC | PRN

## 2022-12-16 NOTE — Progress Notes (Signed)
Pt went to MRI via bed, saline locked for procedure.

## 2022-12-16 NOTE — Progress Notes (Signed)
Progress Note     Subjective: Pt reports ongoing epigastric abdominal pain that is burning. She is not passing flatus. UOP low. Some relief with pain meds but then pain returns when they wear off.   Objective: Vital signs in last 24 hours: Temp:  [97.9 F (36.6 C)-99.1 F (37.3 C)] 98.4 F (36.9 C) (06/14 1013) Pulse Rate:  [78-105] 105 (06/14 1013) Resp:  [16-18] 17 (06/14 1013) BP: (134-171)/(90-100) 134/96 (06/14 1013) SpO2:  [92 %-100 %] 92 % (06/14 1013) Weight:  [64 kg] 64 kg (06/13 1622) Last BM Date : 12/15/22  Intake/Output from previous day: 06/13 0701 - 06/14 0700 In: 1000 [IV Piggyback:1000] Out: -  Intake/Output this shift: No intake/output data recorded.  PE: General: pleasant, WD, WN female who is laying in bed in NAD HEENT: sclera anicteric  Heart: regular, rate, and rhythm.   Lungs: CTAB, no wheezes, rhonchi, or rales noted.  Respiratory effort nonlabored Abd: soft, ttp in epigastric abdomen without peritonitis, moderately distended, BS hypoactive Psych: A&Ox3 with an appropriate affect.    Lab Results:  Recent Labs    12/15/22 1631  WBC 7.9  HGB 15.5*  HCT 44.0  PLT 190   BMET Recent Labs    12/15/22 1631 12/16/22 0058  NA 140 137  K 3.9 3.8  CL 103 102  CO2 24 21*  GLUCOSE 146* 147*  BUN <5* <5*  CREATININE 0.72 0.65  CALCIUM 9.2 8.8*   PT/INR No results for input(s): "LABPROT", "INR" in the last 72 hours. CMP     Component Value Date/Time   NA 137 12/16/2022 0058   K 3.8 12/16/2022 0058   CL 102 12/16/2022 0058   CO2 21 (L) 12/16/2022 0058   GLUCOSE 147 (H) 12/16/2022 0058   BUN <5 (L) 12/16/2022 0058   CREATININE 0.65 12/16/2022 0058   CALCIUM 8.8 (L) 12/16/2022 0058   PROT 6.0 (L) 12/16/2022 0058   ALBUMIN 3.8 12/16/2022 0058   AST 57 (H) 12/16/2022 0058   ALT 48 (H) 12/16/2022 0058   ALKPHOS 56 12/16/2022 0058   BILITOT 2.1 (H) 12/16/2022 0058   GFRNONAA >60 12/16/2022 0058   Lipase     Component Value  Date/Time   LIPASE 469 (H) 12/16/2022 0058       Studies/Results: US Abdomen Limited RUQ (LIVER/GB)  Result Date: 12/15/2022 CLINICAL DATA:  Right upper quadrant pain EXAM: ULTRASOUND ABDOMEN LIMITED RIGHT UPPER QUADRANT COMPARISON:  None Available. FINDINGS: Gallbladder: Gallbladder is well distended with a stone in the gallbladder neck. No wall thickening or pericholecystic fluid is noted. Negative sonographic Murphy's sign is elicited. Common bile duct: Diameter: 4 mm. Liver: Increase in echogenicity consistent with fatty infiltration. Portal vein is patent on color Doppler imaging with normal direction of blood flow towards the liver. Other: None. IMPRESSION: Fatty liver. Cholelithiasis without complicating factors. Electronically Signed   By: Alcide Clever M.D.   On: 12/15/2022 19:48    Anti-infectives: Anti-infectives (From admission, onward)    None        Assessment/Plan  Biliary pancreatitis   - very ttp in epigastric abdomen - not ready for surgical intervention clinically - Tbili  and lipase increased this AM as well, AST/ALT down slightly - ordered MRCP to evaluate for ongoing choledocholithiasis  - Cr improved but UOP low per patient - give 1L bolus this AM and continue IVF @125cc /h - will need GI consult if MRCP + for choledocholithiasis  - ok to have ice chips but would not  start on diet  FEN: ice chips, IVF@125cc /h VTE: SQH ID: no current abx  LOS: 1 day   I reviewed ED provider notes, last 24 h vitals and pain scores, last 48 h intake and output, last 24 h labs and trends, and last 24 h imaging results.   Juliet Rude, [facility] 12/16/2022, 10:28 AM Please see Amion for pager number during day hours 7:00am-4:30pm

## 2022-12-16 NOTE — Progress Notes (Signed)
Abd xray taken earlier after pt vomited once was negative.

## 2022-12-16 NOTE — Progress Notes (Signed)
Pt looks and feels better after second bolus and was able to void a good amount (missed hat), feels like she emptied her bladder. No more vomiting (only once). SCDs on and operating and IS instruction given to pt and she demonstrates correct technique. Plan of care given to her nurse for tonight, Bev. No c/o at this time.

## 2022-12-16 NOTE — Progress Notes (Signed)
   12/16/22 1521  Assess: MEWS Score  Temp 98.6 F (37 C)  BP 126/78  MAP (mmHg) 93  Pulse Rate (!) 122  SpO2 (!) 88 %  O2 Device Room Air  Assess: MEWS Score  MEWS Temp 0  MEWS Systolic 0  MEWS Pulse 2  MEWS RR 0  MEWS LOC 0  MEWS Score 2  MEWS Score Color Yellow  Assess: if the MEWS score is Yellow or Red  Were vital signs taken at a resting state? Yes  Focused Assessment Change from prior assessment (see assessment flowsheet)  Does the patient meet 2 or more of the SIRS criteria? No  MEWS guidelines implemented  Yes, yellow  Treat  MEWS Interventions Considered administering scheduled or prn medications/treatments as ordered  Take Vital Signs  Increase Vital Sign Frequency  Yellow: Q2hr x1, continue Q4hrs until patient remains green for 12hrs  Escalate  MEWS: Escalate Yellow: Discuss with charge nurse and consider notifying provider and/or RRT  Notify: Charge Nurse/RN  Name of Charge Nurse/RN Notified myself Kirtland Bouchard, RN  Provider Notification  Provider Name/Title Bailey Mech, PA for [facility]  Date Provider Notified 12/16/22  Time Provider Notified 1530  Method of Notification Page  Notification Reason Change in status  Provider response En route  Date of Provider Response 12/16/22  Time of Provider Response 1530  Assess: SIRS CRITERIA  SIRS Temperature  0  SIRS Pulse 1  SIRS Respirations  0  SIRS WBC 0  SIRS Score Sum  1   Clemetine Marker, PAs for [facility] up to see pt. Pts sat on 2LO2 is 94% at present. Pulse is 117-119. Pt has received bolus, has had 1 urination of 350 cc of amber urine today.

## 2022-12-16 NOTE — Progress Notes (Addendum)
Notified by RN of tachycardia to the 130's, down to 120 bpm after pain meds Patient also with new O2 requirement this afternoon of 2 L/min via [place]  MRCP reviewed - no choledocholithiasis but pancreatitis with significant stranding and interstitial edema. There are gallstones in the gallbladder near the neck but no other clinical signs of cholecystitis.    I went to examine the patient. She was in no acute distress and appeared to be breathing comfortably on [place]. Her abdomen is firm with some guarding in the epigastric region. No peritonitis.   She describes epigastric pain, nausea, and vomiting. Denies flatus. Last BM was Wednesday. Denies RUQ pain.  Reports she has voided once today, this morning, and her urine was dark (bilirubin 2.1 today from 1.3, prompting MRCP as above)   Assessment and plan:  Biliary pancreatitis with signs of ileus. Suspect tachycardia is multifactorial in the setting of pain, abdominal distention, dehydration.  Give an addition 1 L bolus and increase IVF to 150 mL/hr. Strict I&Os. If vomits again will get KUB, may need an NG tube.  Discussed plan of care with patients RN, Noreene Larsson.  Failure to improve or clinical deterioration may warrant a higher level of care but I do not think she needs to be transferred to progressive care at this time.     Hosie Spangle, PA-C [facility] Please see Amion for pager number during day hours 7:00am-4:30pm

## 2022-12-16 NOTE — Progress Notes (Signed)
Pulse at 135, will recheck.

## 2022-12-17 LAB — COMPREHENSIVE METABOLIC PANEL
ALT: 32 U/L (ref 0–44)
AST: 36 U/L (ref 15–41)
Albumin: 3 g/dL — ABNORMAL LOW (ref 3.5–5.0)
Alkaline Phosphatase: 42 U/L (ref 38–126)
Anion gap: 11 (ref 5–15)
BUN: <5 mg/dL — ABNORMAL LOW (ref 6–20)
CO2: 24 mmol/L (ref 22–32)
Calcium: 8.4 mg/dL — ABNORMAL LOW (ref 8.9–10.3)
Chloride: 100 mmol/L (ref 98–111)
Creatinine, Ser: 0.66 mg/dL (ref 0.44–1.00)
GFR, Estimated: >60 mL/min (ref >60)
Glucose, Bld: 106 mg/dL — ABNORMAL HIGH (ref 70–99)
Potassium: 3.2 mmol/L — ABNORMAL LOW (ref 3.5–5.1)
Sodium: 135 mmol/L (ref 135–145)
Total Bilirubin: 2.2 mg/dL — ABNORMAL HIGH (ref 0.3–1.2)
Total Protein: 5.2 g/dL — ABNORMAL LOW (ref 6.5–8.1)

## 2022-12-17 LAB — CBC
HCT: 43.6 % (ref 36.0–46.0)
Hemoglobin: 15 g/dL (ref 12.0–15.0)
MCH: 39.5 pg — ABNORMAL HIGH (ref 26.0–34.0)
MCHC: 34.4 g/dL (ref 30.0–36.0)
MCV: 114.7 fL — ABNORMAL HIGH (ref 80.0–100.0)
Platelets: 113 10*3/uL — ABNORMAL LOW (ref 150–400)
RBC: 3.8 MIL/uL — ABNORMAL LOW (ref 3.87–5.11)
RDW: 14.5 % (ref 11.5–15.5)
WBC: 14.5 10*3/uL — ABNORMAL HIGH (ref 4.0–10.5)
nRBC: 0 % (ref 0.0–0.2)

## 2022-12-17 LAB — LIPASE, BLOOD: Lipase: 135 U/L — ABNORMAL HIGH (ref 11–51)

## 2022-12-17 MED ORDER — SODIUM CHLORIDE 0.9 % IV SOLN
2.0000 g | INTRAVENOUS | Status: DC
  Administered 2022-12-17 – 2022-12-22 (×6): 2 g via INTRAVENOUS
  Filled 2022-12-17 (×7): qty 20

## 2022-12-17 NOTE — Progress Notes (Signed)
Progress Note     Subjective: Reports feeling better today. Nausea better controlled but still having some dry heaves. Reports voiding 3x since additional bolus/increase in IVF yesterday afternoon. Asking to get OOB.   Objective: Vital signs in last 24 hours: Temp:  [97.9 F (36.6 C)-98.7 F (37.1 C)] 98.1 F (36.7 C) (06/15 0549) Pulse Rate:  [84-135] 84 (06/15 0549) Resp:  [17-18] 18 (06/15 0549) BP: (117-145)/(71-98) 143/86 (06/15 0549) SpO2:  [87 %-94 %] 90 % (06/15 0549) Last BM Date : 12/15/22  Intake/Output from previous day: 06/14 0701 - 06/15 0700 In: 1000 [IV Piggyback:1000] Out: 525 [Urine:350; Emesis/NG output:175] Intake/Output this shift: No intake/output data recorded.  PE: General: pleasant, WD, WN female who is laying in bed in NAD HEENT: sclera anicteric  Heart: regular, rate, and rhythm.   Lungs: CTAB, no wheezes, rhonchi, or rales noted.  Respiratory effort nonlabored ORA Abd: full/firm but not rigid,  ttp in epigastric abdomen without peritonitis, moderately distended, BS hypoactive Psych: A&Ox3 with an appropriate affect.    Lab Results:  Recent Labs    12/15/22 1631 12/17/22 0149  WBC 7.9 14.5*  HGB 15.5* 15.0  HCT 44.0 43.6  PLT 190 113*   BMET Recent Labs    12/16/22 0058 12/17/22 0149  NA 137 135  K 3.8 3.2*  CL 102 100  CO2 21* 24  GLUCOSE 147* 106*  BUN <5* <5*  CREATININE 0.65 0.66  CALCIUM 8.8* 8.4*   PT/INR No results for input(s): "LABPROT", "INR" in the last 72 hours. CMP     Component Value Date/Time   NA 135 12/17/2022 0149   K 3.2 (L) 12/17/2022 0149   CL 100 12/17/2022 0149   CO2 24 12/17/2022 0149   GLUCOSE 106 (H) 12/17/2022 0149   BUN <5 (L) 12/17/2022 0149   CREATININE 0.66 12/17/2022 0149   CALCIUM 8.4 (L) 12/17/2022 0149   PROT 5.2 (L) 12/17/2022 0149   ALBUMIN 3.0 (L) 12/17/2022 0149   AST 36 12/17/2022 0149   ALT 32 12/17/2022 0149   ALKPHOS 42 12/17/2022 0149   BILITOT 2.2 (H) 12/17/2022 0149    GFRNONAA >60 12/17/2022 0149   Lipase     Component Value Date/Time   LIPASE 135 (H) 12/17/2022 0149       Studies/Results: DG Abd Portable 1V  Result Date: 12/16/2022 CLINICAL DATA:  Gallstones EXAM: PORTABLE ABDOMEN - 1 VIEW COMPARISON:  MRI abdomen 12/16/2022 FINDINGS: The bowel gas pattern is normal. No radio-opaque calculi or other significant radiographic abnormality are seen. IMPRESSION: Negative. Electronically Signed   By: Darliss Cheney M.D.   On: 12/16/2022 16:56   MR ABDOMEN MRCP W WO CONTAST  Result Date: 12/16/2022 CLINICAL DATA:  Pancreatitis. EXAM: MRI ABDOMEN WITHOUT AND WITH CONTRAST (INCLUDING MRCP) TECHNIQUE: Multiplanar multisequence MR imaging of the abdomen was performed both before and after the administration of intravenous contrast. Heavily T2-weighted images of the biliary and pancreatic ducts were obtained, and three-dimensional MRCP images were rendered by post processing. CONTRAST:  6mL GADAVIST GADOBUTROL 1 MMOL/ML IV SOLN COMPARISON:  Ultrasound 12/15/2022. FINDINGS: Lower chest: Tiny bilateral pleural effusions. Slightly patulous esophagus with some luminal fluid and wall thickening. Hepatobiliary: There is diffuse signal dropout on out of phase imaging in the liver consistent with fatty liver infiltration. No restricted diffusion. Hypervascular liver lesion. Patent portal vein. Distended gallbladder with stone towards the neck. Stone measures 17 mm. There is no biliary ductal dilatation. Common duct measures up to 3 mm. Pancreas: Pancreas  is diffusely edematous and enlarged. No pancreatic mass, fluid collection. Severe peripancreatic fat stranding with fluid tracking into the central mesentery and anterior perinephric spaces bilaterally. Fluid also tracks along the pericolic gutters. Pancreatic duct is nondilated. Spleen: Spleen is nonenlarged. Preserved enhancement. The splenic vein is intact. Small splenule. Adrenals/Urinary Tract: Adrenal glands are preserved.  No enhancing renal mass or collecting system dilatation. Parapelvic left-sided small renal cysts are identified. Stomach/Bowel: Stomach is distended with fluid. The visualized portions of the small and large bowel are nondilated. Vascular/Lymphatic: No pathologically enlarged lymph nodes identified. No abdominal aortic aneurysm demonstrated. Other:  Scattered ascites. Musculoskeletal: Mild degenerative changes of the spine. Multiple areas of presumed vertebral body hemangiomas. IMPRESSION: Changes of interstitial pancreatitis. No areas of pancreatic necrosis, cystic change or rim enhancing fluid collections at this time. Extensive central mesenteric fluid and stranding with fluid tracking along the anterior perinephric spaces and pericolic gutters. Intact portal venous system. 17 mm stone in the gallbladder. No biliary ductal pancreatic ductal dilatation. Fatty liver infiltration. Electronically Signed   By: Karen Kays M.D.   On: 12/16/2022 14:53   US Abdomen Limited RUQ (LIVER/GB)  Result Date: 12/15/2022 CLINICAL DATA:  Right upper quadrant pain EXAM: ULTRASOUND ABDOMEN LIMITED RIGHT UPPER QUADRANT COMPARISON:  None Available. FINDINGS: Gallbladder: Gallbladder is well distended with a stone in the gallbladder neck. No wall thickening or pericholecystic fluid is noted. Negative sonographic Murphy's sign is elicited. Common bile duct: Diameter: 4 mm. Liver: Increase in echogenicity consistent with fatty infiltration. Portal vein is patent on color Doppler imaging with normal direction of blood flow towards the liver. Other: None. IMPRESSION: Fatty liver. Cholelithiasis without complicating factors. Electronically Signed   By: Alcide Clever M.D.   On: 12/15/2022 19:48    Anti-infectives: Anti-infectives (From admission, onward)    None        Assessment/Plan  Biliary pancreatitis  - RUQ U/S 6/13 with gallstone in the neck of the gallbladder, no signs of cholecystitis  - MRCP 6/14 negative for  choledocho. Showed significant interstitial pancreatitis w/o necrosis or pseudocyst. 17 mm gallstone towards the neck of the gallbladder without biliary dilation. - s/p 2 L bolus 6/14, IVF at 150 mL/hr  - WBC 14.5 from 7.9 yesterday - lipase 135 from 469 - AST/ALT/Alk phos, WNL. Bili stable at 2.2  - will ultimately need cholecystectomy, timing to be determined based on clinical course of pancreatitis   FEN: ice chips, IVF@150cc /h VTE: SQH ID: no current abx; I suspect her leukocytosis is mostly driven by pancreatitis and have a low suspicion for cholecystitis. Given increase in WBC and gallstones near neck of gallbladder will discuss starting abx with MD but I will not order for now, as my clinical suspicion is higher for acute pancreatitis without signs of infection.  Dispo: med-surg, allow sips of clears, IVF and supportive care for pancreatitis.    LOS: 2 days   I reviewed ED provider notes, last 24 h vitals and pain scores, last 48 h intake and output, last 24 h labs and trends, and last 24 h imaging results.   Adam Phenix, [facility] 12/17/2022, 7:29 AM Please see Amion for pager number during day hours 7:00am-4:30pm

## 2022-12-17 NOTE — Plan of Care (Signed)
  Problem: Clinical Measurements: Goal: Ability to maintain clinical measurements within normal limits will improve Outcome: Progressing   Problem: Clinical Measurements: Goal: Will remain free from infection Outcome: Progressing   Problem: Clinical Measurements: Goal: Diagnostic test results will improve Outcome: Progressing   Problem: Nutrition: Goal: Adequate nutrition will be maintained Outcome: Progressing   Problem: Elimination: Goal: Will not experience complications related to bowel motility Outcome: Progressing   Problem: Pain Managment: Goal: General experience of comfort will improve Outcome: Progressing   

## 2022-12-17 NOTE — Plan of Care (Signed)
  Problem: Clinical Measurements: Goal: Will remain free from infection 12/17/2022 2227 by Arvilla Market, RN Outcome: Progressing   Problem: Clinical Measurements: Goal: Will remain free from infection 12/17/2022 2227 by Arvilla Market, RN Outcome: Progressing 12/17/2022 1057 by Arvilla Market, RN Outcome: Progressing   Problem: Activity: Goal: Risk for activity intolerance will decrease 12/17/2022 2227 by Arvilla Market, RN Outcome: Progressing 12/17/2022 1057 by Arvilla Market, RN Outcome: Progressing   Problem: Coping: Goal: Level of anxiety will decrease 12/17/2022 2227 by Arvilla Market, RN Outcome: Progressing 12/17/2022 1057 by Arvilla Market, RN Outcome: Progressing   Problem: Elimination: Goal: Will not experience complications related to bowel motility 12/17/2022 2227 by Arvilla Market, RN Outcome: Progressing 12/17/2022 1057 by Arvilla Market, RN Outcome: Progressing   Problem: Safety: Goal: Ability to remain free from injury will improve 12/17/2022 2227 by Arvilla Market, RN Outcome: Progressing 12/17/2022 1057 by Arvilla Market, RN Outcome: Progressing   Problem: Skin Integrity: Goal: Risk for impaired skin integrity will decrease 12/17/2022 2227 by Arvilla Market, RN Outcome: Progressing 12/17/2022 1057 by Arvilla Market, RN Outcome: Progressing

## 2022-12-17 NOTE — Plan of Care (Signed)

## 2022-12-18 ENCOUNTER — Inpatient Hospital Stay (HOSPITAL_COMMUNITY): Payer: BC Managed Care – PPO

## 2022-12-18 LAB — COMPREHENSIVE METABOLIC PANEL
ALT: 34 U/L (ref 0–44)
AST: 53 U/L — ABNORMAL HIGH (ref 15–41)
Albumin: 2.6 g/dL — ABNORMAL LOW (ref 3.5–5.0)
Alkaline Phosphatase: 45 U/L (ref 38–126)
Anion gap: 15 (ref 5–15)
BUN: <5 mg/dL — ABNORMAL LOW (ref 6–20)
CO2: 24 mmol/L (ref 22–32)
Calcium: 8.1 mg/dL — ABNORMAL LOW (ref 8.9–10.3)
Chloride: 95 mmol/L — ABNORMAL LOW (ref 98–111)
Creatinine, Ser: 0.63 mg/dL (ref 0.44–1.00)
GFR, Estimated: >60 mL/min (ref >60)
Glucose, Bld: 104 mg/dL — ABNORMAL HIGH (ref 70–99)
Potassium: 2.9 mmol/L — ABNORMAL LOW (ref 3.5–5.1)
Sodium: 134 mmol/L — ABNORMAL LOW (ref 135–145)
Total Bilirubin: 2 mg/dL — ABNORMAL HIGH (ref 0.3–1.2)
Total Protein: 5.3 g/dL — ABNORMAL LOW (ref 6.5–8.1)

## 2022-12-18 NOTE — Progress Notes (Signed)
Progress Note     Subjective: Reports feeling better today. Cc is SOB "feels like all this fluid is in my lungs", abdominal tightness, and dark urine.  She remains off of O2. Dranks some clears but states she cannot drink much. +flatus. No Bm since Wednesday.  Objective: Vital signs in last 24 hours: Temp:  [98.3 F (36.8 C)-99.4 F (37.4 C)] 98.4 F (36.9 C) (06/16 0742) Pulse Rate:  [87-93] 88 (06/16 0742) Resp:  [16-18] 17 (06/16 0742) BP: (131-141)/(75-94) 131/90 (06/16 0742) SpO2:  [90 %-96 %] 90 % (06/16 0742) Last BM Date : 12/15/22  Intake/Output from previous day: 06/15 0701 - 06/16 0700 In: 4100 [I.V.:4000; IV Piggyback:100] Out: 400 [Urine:400] Intake/Output this shift: No intake/output data recorded.  PE: General: pleasant, WD, WN female who is laying in bed in NAD HEENT: sclera anicteric  Heart: regular, rate, and rhythm.   Lungs: CTAB, no wheezes, rhonchi, or rales noted.  Respiratory effort nonlabored ORA Abd: full/firm but not rigid,  ttp in epigastric abdomen without peritonitis - improved compared to yesterday, moderately distended, BS hypoactive Psych: A&Ox3 with an appropriate affect.    Lab Results:  Recent Labs    12/15/22 1631 12/17/22 0149  WBC 7.9 14.5*  HGB 15.5* 15.0  HCT 44.0 43.6  PLT 190 113*   BMET Recent Labs    12/17/22 0149 12/18/22 0748  NA 135 134*  K 3.2* 2.9*  CL 100 95*  CO2 24 24  GLUCOSE 106* 104*  BUN <5* <5*  CREATININE 0.66 0.63  CALCIUM 8.4* 8.1*   PT/INR No results for input(s): "LABPROT", "INR" in the last 72 hours. CMP     Component Value Date/Time   NA 134 (L) 12/18/2022 0748   K 2.9 (L) 12/18/2022 0748   CL 95 (L) 12/18/2022 0748   CO2 24 12/18/2022 0748   GLUCOSE 104 (H) 12/18/2022 0748   BUN <5 (L) 12/18/2022 0748   CREATININE 0.63 12/18/2022 0748   CALCIUM 8.1 (L) 12/18/2022 0748   PROT 5.3 (L) 12/18/2022 0748   ALBUMIN 2.6 (L) 12/18/2022 0748   AST 53 (H) 12/18/2022 0748   ALT 34  12/18/2022 0748   ALKPHOS 45 12/18/2022 0748   BILITOT 2.0 (H) 12/18/2022 0748   GFRNONAA >60 12/18/2022 0748   Lipase     Component Value Date/Time   LIPASE 135 (H) 12/17/2022 0149       Studies/Results: MR 3D Recon At Scanner  Result Date: 12/17/2022 CLINICAL DATA:  Nonspecific (abnormal) findings on radiological and other examination of abdominal and biliary system EXAM: 3-DIMENSIONAL MR IMAGE RENDERING ON ACQUISITION WORKSTATION TECHNIQUE: 3-dimensional MR images were rendered by post-processing of the original MR data on an acquisition workstation. The 3-dimensional MR images were interpreted and findings were reported in the accompanying complete MR report for this study COMPARISON:  None Available. FINDINGS: Please see separate dictation of MRI of the abdomen for details. IMPRESSION: As above Electronically Signed   By: Karen Kays M.D.   On: 12/17/2022 11:57   DG Abd Portable 1V  Result Date: 12/16/2022 CLINICAL DATA:  Gallstones EXAM: PORTABLE ABDOMEN - 1 VIEW COMPARISON:  MRI abdomen 12/16/2022 FINDINGS: The bowel gas pattern is normal. No radio-opaque calculi or other significant radiographic abnormality are seen. IMPRESSION: Negative. Electronically Signed   By: Darliss Cheney M.D.   On: 12/16/2022 16:56   MR ABDOMEN MRCP W WO CONTAST  Result Date: 12/16/2022 CLINICAL DATA:  Pancreatitis. EXAM: MRI ABDOMEN WITHOUT AND WITH CONTRAST (INCLUDING  MRCP) TECHNIQUE: Multiplanar multisequence MR imaging of the abdomen was performed both before and after the administration of intravenous contrast. Heavily T2-weighted images of the biliary and pancreatic ducts were obtained, and three-dimensional MRCP images were rendered by post processing. CONTRAST:  6mL GADAVIST GADOBUTROL 1 MMOL/ML IV SOLN COMPARISON:  Ultrasound 12/15/2022. FINDINGS: Lower chest: Tiny bilateral pleural effusions. Slightly patulous esophagus with some luminal fluid and wall thickening. Hepatobiliary: There is diffuse  signal dropout on out of phase imaging in the liver consistent with fatty liver infiltration. No restricted diffusion. Hypervascular liver lesion. Patent portal vein. Distended gallbladder with stone towards the neck. Stone measures 17 mm. There is no biliary ductal dilatation. Common duct measures up to 3 mm. Pancreas: Pancreas is diffusely edematous and enlarged. No pancreatic mass, fluid collection. Severe peripancreatic fat stranding with fluid tracking into the central mesentery and anterior perinephric spaces bilaterally. Fluid also tracks along the pericolic gutters. Pancreatic duct is nondilated. Spleen: Spleen is nonenlarged. Preserved enhancement. The splenic vein is intact. Small splenule. Adrenals/Urinary Tract: Adrenal glands are preserved. No enhancing renal mass or collecting system dilatation. Parapelvic left-sided small renal cysts are identified. Stomach/Bowel: Stomach is distended with fluid. The visualized portions of the small and large bowel are nondilated. Vascular/Lymphatic: No pathologically enlarged lymph nodes identified. No abdominal aortic aneurysm demonstrated. Other:  Scattered ascites. Musculoskeletal: Mild degenerative changes of the spine. Multiple areas of presumed vertebral body hemangiomas. IMPRESSION: Changes of interstitial pancreatitis. No areas of pancreatic necrosis, cystic change or rim enhancing fluid collections at this time. Extensive central mesenteric fluid and stranding with fluid tracking along the anterior perinephric spaces and pericolic gutters. Intact portal venous system. 17 mm stone in the gallbladder. No biliary ductal pancreatic ductal dilatation. Fatty liver infiltration. Electronically Signed   By: Karen Kays M.D.   On: 12/16/2022 14:53    Anti-infectives: Anti-infectives (From admission, onward)    Start     Dose/Rate Route Frequency Ordered Stop   12/17/22 1230  cefTRIAXone (ROCEPHIN) 2 g in sodium chloride 0.9 % 100 mL IVPB       Note to  Pharmacy: Pharmacy may adjust dosing strength / duration / interval for maximal efficacy   2 g 200 mL/hr over 30 Minutes Intravenous Every 24 hours 12/17/22 1130          Assessment/Plan  Biliary pancreatitis  - RUQ U/S 6/13 with gallstone in the neck of the gallbladder, no signs of cholecystitis  - MRCP 6/14 negative for choledocho. Showed significant interstitial pancreatitis w/o necrosis or pseudocyst. 17 mm gallstone towards the neck of the gallbladder without biliary dilation. - s/p 2 L bolus 6/14, IVF at 150 mL/hr, decrease IVF to 75 mL/hr today  - CXR given patient reported SOB.  - Continue CLD - AST/ALT/Alk phos WNL. Bili stable at 2.0 - AM labs: CBC, CMP, lipase  - will ultimately need cholecystectomy, timing to be determined based on clinical course of pancreatitis   FEN: ice chips, IVF@75cc /h VTE: SQH ID: Rocephin for possible cholecystitis   Dispo: med-surg, CLD, IVF and supportive care for pancreatitis.    LOS: 3 days   I reviewed ED provider notes, last 24 h vitals and pain scores, last 48 h intake and output, last 24 h labs and trends, and last 24 h imaging results.   Adam Phenix, [facility] Surgery 12/18/2022, 9:51 AM Please see Amion for pager number during day hours 7:00am-4:30pm

## 2022-12-19 LAB — COMPREHENSIVE METABOLIC PANEL
ALT: 34 U/L (ref 0–44)
AST: 59 U/L — ABNORMAL HIGH (ref 15–41)
Albumin: 2.7 g/dL — ABNORMAL LOW (ref 3.5–5.0)
Alkaline Phosphatase: 58 U/L (ref 38–126)
Anion gap: 14 (ref 5–15)
BUN: <5 mg/dL — ABNORMAL LOW (ref 6–20)
CO2: 26 mmol/L (ref 22–32)
Calcium: 8.6 mg/dL — ABNORMAL LOW (ref 8.9–10.3)
Chloride: 99 mmol/L (ref 98–111)
Creatinine, Ser: 0.58 mg/dL (ref 0.44–1.00)
GFR, Estimated: >60 mL/min (ref >60)
Glucose, Bld: 94 mg/dL (ref 70–99)
Potassium: 2.7 mmol/L — CL (ref 3.5–5.1)
Sodium: 139 mmol/L (ref 135–145)
Total Bilirubin: 1.5 mg/dL — ABNORMAL HIGH (ref 0.3–1.2)
Total Protein: 5.6 g/dL — ABNORMAL LOW (ref 6.5–8.1)

## 2022-12-19 LAB — CBC
HCT: 40.3 % (ref 36.0–46.0)
Hemoglobin: 13.7 g/dL (ref 12.0–15.0)
MCH: 38.1 pg — ABNORMAL HIGH (ref 26.0–34.0)
MCHC: 34 g/dL (ref 30.0–36.0)
MCV: 111.9 fL — ABNORMAL HIGH (ref 80.0–100.0)
Platelets: 114 10*3/uL — ABNORMAL LOW (ref 150–400)
RBC: 3.6 MIL/uL — ABNORMAL LOW (ref 3.87–5.11)
RDW: 13.6 % (ref 11.5–15.5)
WBC: 9 10*3/uL (ref 4.0–10.5)
nRBC: 0 % (ref 0.0–0.2)

## 2022-12-19 LAB — LIPASE, BLOOD: Lipase: 44 U/L (ref 11–51)

## 2022-12-19 MED ORDER — KCL IN DEXTROSE-NACL 20-5-0.45 MEQ/L-%-% IV SOLN
INTRAVENOUS | Status: DC
  Filled 2022-12-19: qty 1000

## 2022-12-19 MED ORDER — FUROSEMIDE 10 MG/ML IJ SOLN
40.0000 mg | Freq: Once | INTRAMUSCULAR | Status: AC
  Administered 2022-12-19: 40 mg via INTRAVENOUS
  Filled 2022-12-19: qty 4

## 2022-12-19 MED ORDER — POLYETHYLENE GLYCOL 3350 17 G PO PACK
17.0000 g | PACK | Freq: Two times a day (BID) | ORAL | Status: DC
  Administered 2022-12-19 – 2022-12-22 (×5): 17 g via ORAL
  Filled 2022-12-19 (×7): qty 1

## 2022-12-19 MED ORDER — POTASSIUM CHLORIDE CRYS ER 20 MEQ PO TBCR
40.0000 meq | EXTENDED_RELEASE_TABLET | Freq: Once | ORAL | Status: AC
  Administered 2022-12-19: 40 meq via ORAL
  Filled 2022-12-19: qty 2

## 2022-12-19 MED ORDER — POTASSIUM CHLORIDE 10 MEQ/100ML IV SOLN
10.0000 meq | INTRAVENOUS | Status: AC
  Administered 2022-12-19 (×4): 10 meq via INTRAVENOUS
  Filled 2022-12-19 (×2): qty 100

## 2022-12-19 MED ORDER — POTASSIUM CHLORIDE CRYS ER 20 MEQ PO TBCR
20.0000 meq | EXTENDED_RELEASE_TABLET | Freq: Once | ORAL | Status: AC
  Administered 2022-12-19: 20 meq via ORAL
  Filled 2022-12-19: qty 1

## 2022-12-19 NOTE — Progress Notes (Addendum)
CRITICAL VALUE STICKER  CRITICAL VALUE: K 2.7  RECEIVER (on-site recipient of call): Alessandra Bevels LPN  DATE & TIME 12/19/22 227 NOTIFIED:   MESSENGER [facility]Pait (representative from lab):  MD NOTIFIED: Dr. Sophronia Simas   TIME OF 12/19/22@ 318 NOTIFICATION:  RESPONSE:  MD informed potassium order placed.

## 2022-12-19 NOTE — Plan of Care (Signed)
Pt receiving some lasix this am for fluid retention and is sitting up in the gas chair, using her IS. Pt had a BM this am as well, abd is firm and tight.

## 2022-12-19 NOTE — Progress Notes (Signed)
Went into patient's room to assess and administer medication. She refused miralax stating that she had had a BM today and she did not want to be up all night going to the bathroom. She also stated that she did not want me to wake her at midnight to administer tylenol. She had on her street clothes and I offered a hospital gown which she refused. I explained that if she had surgery tomorrow she would need a CHG bath and a gown and she agreed.

## 2022-12-19 NOTE — Progress Notes (Addendum)
Progress Note     Subjective: Reports feeling better each day - now almost pain free but distended.   She remains off of O2. Dranks some clears but states she cannot drink much. +flatus. No Bm since Wednesday.  Objective: Vital signs in last 24 hours: Temp:  [98.2 F (36.8 C)-98.6 F (37 C)] 98.6 F (37 C) (06/17 0306) Pulse Rate:  [82-83] 83 (06/17 0306) Resp:  [18] 18 (06/17 0306) BP: (140-160)/(84-94) 140/84 (06/17 0306) SpO2:  [91 %] 91 % (06/17 0306) Last BM Date : 12/15/22  Intake/Output from previous day: No intake/output data recorded. Intake/Output this shift: No intake/output data recorded.  PE: General: pleasant, comfortable HEENT: sclera anicteric  Heart: rrr Lungs: CTAB, no wheezes, rhonchi, or rales noted.  Respiratory effort nonlabored ORA Abd: full/firm relative to admission, dramatic improvement in tenderness - now ~nontender; moderate distention Psych: A&Ox3 with an appropriate affect.    Lab Results:  Recent Labs    12/17/22 0149 12/19/22 0013  WBC 14.5* 9.0  HGB 15.0 13.7  HCT 43.6 40.3  PLT 113* 114*   BMET Recent Labs    12/18/22 0748 12/19/22 0013  NA 134* 139  K 2.9* 2.7*  CL 95* 99  CO2 24 26  GLUCOSE 104* 94  BUN <5* <5*  CREATININE 0.63 0.58  CALCIUM 8.1* 8.6*   PT/INR No results for input(s): "LABPROT", "INR" in the last 72 hours. CMP     Component Value Date/Time   NA 139 12/19/2022 0013   K 2.7 (LL) 12/19/2022 0013   CL 99 12/19/2022 0013   CO2 26 12/19/2022 0013   GLUCOSE 94 12/19/2022 0013   BUN <5 (L) 12/19/2022 0013   CREATININE 0.58 12/19/2022 0013   CALCIUM 8.6 (L) 12/19/2022 0013   PROT 5.6 (L) 12/19/2022 0013   ALBUMIN 2.7 (L) 12/19/2022 0013   AST 59 (H) 12/19/2022 0013   ALT 34 12/19/2022 0013   ALKPHOS 58 12/19/2022 0013   BILITOT 1.5 (H) 12/19/2022 0013   GFRNONAA >60 12/19/2022 0013   Lipase     Component Value Date/Time   LIPASE 44 12/19/2022 0013       Studies/Results: DG CHEST  PORT 1 VIEW  Result Date: 12/18/2022 CLINICAL DATA:  Shortness of breath. EXAM: PORTABLE CHEST 1 VIEW COMPARISON:  None Available. FINDINGS: The heart size is normal. Lung volumes are low. Asymmetric left lower lobe airspace opacity is present. Left greater than right pleural effusion is present. The upper lung fields are clear. The visualized soft tissues and bony thorax are unremarkable. IMPRESSION: 1. Asymmetric left lower lobe airspace opacity concerning for pneumonia. 2. Left greater than right pleural effusions. Electronically Signed   By: Marin Roberts M.D.   On: 12/18/2022 12:05    Anti-infectives: Anti-infectives (From admission, onward)    Start     Dose/Rate Route Frequency Ordered Stop   12/17/22 1230  cefTRIAXone (ROCEPHIN) 2 g in sodium chloride 0.9 % 100 mL IVPB       Note to Pharmacy: Pharmacy may adjust dosing strength / duration / interval for maximal efficacy   2 g 200 mL/hr over 30 Minutes Intravenous Every 24 hours 12/17/22 1130          Assessment/Plan  Biliary pancreatitis  - RUQ U/S 6/13 with gallstone in the neck of the gallbladder, no signs of cholecystitis  - MRCP 6/14 negative for choledocho. Showed significant interstitial pancreatitis w/o necrosis or pseudocyst. 17 mm gallstone towards the neck of the gallbladder without biliary  dilation. - D/C MIVF, Full liquids - CXR 6/16 possible LL pneumonia - on Rocephin - Hypokalemia - replace - given 40 mEq runs IV; adding 20 mEq PO as well - AST/ALT/Alk phos WNL. Bili down to 1.5; lipase normalized - AM labs: CBC, CMP, lipase - will ultimately need cholecystectomy, timing to be determined based on clinical course of pancreatitis   FEN: full liquids, D/C MIVF today; BID miralax VTE: SQH ID: Rocephin for possible cholecystitis   Dispo: med-surg, CLD, IVF and supportive care for pancreatitis.  I spent a total of 50 minutes in both face-to-face and non-face-to-face activities, excluding procedures performed,  for this visit on the date of this encounter.  Marin Olp, MD [facility], A [facility]

## 2022-12-20 LAB — COMPREHENSIVE METABOLIC PANEL
ALT: 33 U/L (ref 0–44)
AST: 50 U/L — ABNORMAL HIGH (ref 15–41)
Albumin: 3 g/dL — ABNORMAL LOW (ref 3.5–5.0)
Alkaline Phosphatase: 70 U/L (ref 38–126)
Anion gap: 11 (ref 5–15)
BUN: 5 mg/dL — ABNORMAL LOW (ref 6–20)
CO2: 26 mmol/L (ref 22–32)
Calcium: 8.8 mg/dL — ABNORMAL LOW (ref 8.9–10.3)
Chloride: 97 mmol/L — ABNORMAL LOW (ref 98–111)
Creatinine, Ser: 0.6 mg/dL (ref 0.44–1.00)
GFR, Estimated: >60 mL/min (ref >60)
Glucose, Bld: 99 mg/dL (ref 70–99)
Potassium: 2.7 mmol/L — CL (ref 3.5–5.1)
Sodium: 134 mmol/L — ABNORMAL LOW (ref 135–145)
Total Bilirubin: 1 mg/dL (ref 0.3–1.2)
Total Protein: 6 g/dL — ABNORMAL LOW (ref 6.5–8.1)

## 2022-12-20 LAB — LIPASE, BLOOD: Lipase: 93 U/L — ABNORMAL HIGH (ref 11–51)

## 2022-12-20 LAB — MAGNESIUM: Magnesium: 1.8 mg/dL (ref 1.7–2.4)

## 2022-12-20 MED ORDER — POTASSIUM CHLORIDE CRYS ER 20 MEQ PO TBCR
20.0000 meq | EXTENDED_RELEASE_TABLET | Freq: Once | ORAL | Status: AC
  Administered 2022-12-20: 20 meq via ORAL
  Filled 2022-12-20: qty 1

## 2022-12-20 MED ORDER — POTASSIUM CHLORIDE 10 MEQ/100ML IV SOLN
10.0000 meq | INTRAVENOUS | Status: AC
  Administered 2022-12-20 (×4): 10 meq via INTRAVENOUS
  Filled 2022-12-20 (×4): qty 100

## 2022-12-20 MED ORDER — POTASSIUM CHLORIDE CRYS ER 20 MEQ PO TBCR: 40.0000 meq | EXTENDED_RELEASE_TABLET | Freq: Once | ORAL | Status: DC

## 2022-12-20 MED ORDER — KCL-LACTATED RINGERS-D5W 20 MEQ/L IV SOLN
INTRAVENOUS | Status: DC
  Filled 2022-12-20: qty 1000

## 2022-12-20 MED ORDER — POTASSIUM CHLORIDE CRYS ER 20 MEQ PO TBCR
40.0000 meq | EXTENDED_RELEASE_TABLET | Freq: Once | ORAL | Status: AC
  Administered 2022-12-20: 40 meq via ORAL
  Filled 2022-12-20: qty 2

## 2022-12-20 MED ORDER — FUROSEMIDE 10 MG/ML IJ SOLN
20.0000 mg | Freq: Once | INTRAMUSCULAR | Status: AC
  Administered 2022-12-20: 20 mg via INTRAVENOUS
  Filled 2022-12-20: qty 2

## 2022-12-20 NOTE — Progress Notes (Signed)
Progress Note     Subjective: Reports feeling better each day - now almost pain free distention slowing improving  Doing better each day, now pain free. Hungry  Objective: Vital signs in last 24 hours: Temp:  [98.5 F (36.9 C)-99.2 F (37.3 C)] 99.2 F (37.3 C) (06/18 0421) Pulse Rate:  [81-84] 81 (06/18 0421) Resp:  [16-18] 17 (06/18 0421) BP: (139-144)/(84-96) 144/92 (06/18 0421) SpO2:  [93 %-97 %] 93 % (06/18 0421) Last BM Date : 12/19/22  Intake/Output from previous day: 06/17 0701 - 06/18 0700 In: 430.7 [IV Piggyback:430.7] Out: 1001 [Urine:1000; Stool:1] Intake/Output this shift: No intake/output data recorded.  PE: General: pleasant, comfortable HEENT: sclera anicteric  Heart: rrr Lungs: Respiratory effort nonlabored ORA Abd: full, dramatic improvement in tenderness - now ~nontender; mild to moderate distention Psych: A&Ox3 with an appropriate affect.    Lab Results:  Recent Labs    12/19/22 0013  WBC 9.0  HGB 13.7  HCT 40.3  PLT 114*   BMET Recent Labs    12/19/22 0013 12/20/22 0013  NA 139 134*  K 2.7* 2.7*  CL 99 97*  CO2 26 26  GLUCOSE 94 99  BUN <5* 5*  CREATININE 0.58 0.60  CALCIUM 8.6* 8.8*   PT/INR No results for input(s): "LABPROT", "INR" in the last 72 hours. CMP     Component Value Date/Time   NA 134 (L) 12/20/2022 0013   K 2.7 (LL) 12/20/2022 0013   CL 97 (L) 12/20/2022 0013   CO2 26 12/20/2022 0013   GLUCOSE 99 12/20/2022 0013   BUN 5 (L) 12/20/2022 0013   CREATININE 0.60 12/20/2022 0013   CALCIUM 8.8 (L) 12/20/2022 0013   PROT 6.0 (L) 12/20/2022 0013   ALBUMIN 3.0 (L) 12/20/2022 0013   AST 50 (H) 12/20/2022 0013   ALT 33 12/20/2022 0013   ALKPHOS 70 12/20/2022 0013   BILITOT 1.0 12/20/2022 0013   GFRNONAA >60 12/20/2022 0013   Lipase     Component Value Date/Time   LIPASE 93 (H) 12/20/2022 0013       Studies/Results: No results found.  Anti-infectives: Anti-infectives (From admission, onward)     Start     Dose/Rate Route Frequency Ordered Stop   12/17/22 1230  cefTRIAXone (ROCEPHIN) 2 g in sodium chloride 0.9 % 100 mL IVPB       Note to Pharmacy: Pharmacy may adjust dosing strength / duration / interval for maximal efficacy   2 g 200 mL/hr over 30 Minutes Intravenous Every 24 hours 12/17/22 1130          Assessment/Plan  Biliary pancreatitis  - RUQ U/S 6/13 with gallstone in the neck of the gallbladder, no signs of cholecystitis  - MRCP 6/14 negative for choledocho. Showed significant interstitial pancreatitis w/o necrosis or pseudocyst. 17 mm gallstone towards the neck of the gallbladder without biliary dilation. - Distention improving gradually; addn'l dose of lasix today - 20mg  - Heart healthy/low fat diet; NPO after midnight tonight - CXR 6/16 possible LL pneumonia - on Rocephin - Hypokalemia - replace again today - she is getting IV + PO replacement - AST/ALT/Alk phos WNL. Bili normalized; lipase normalized (93 today, mild elev);  - AM labs: CBC, CMP, lipase - will ultimately need cholecystectomy, timing to be determined based on clinical course of pancreatitis - possible OR tomorrow if distention continues to improve   FEN: heart healthy, D/C MIVF today; BID miralax VTE: SQH ID: Rocephin for possible cholecystitis   Dispo: med-surg, supportive care  for pancreatitis.  I spent a total of 35 minutes in both face-to-face and non-face-to-face activities, excluding procedures performed, for this visit on the date of this encounter.  Marin Olp, MD [facility], [facility]

## 2022-12-20 NOTE — TOC Initial Note (Addendum)
Transition of Care [facility]) - Initial/Assessment Note    Patient Details  Name: Kimberly [name] MRN: [id number] Date of Birth: Oct 27, 1970  Transition of [facility]) CM/SW Contact:    Kingsley Plan, RN Phone Number: 12/20/2022, 3:11 PM  Clinical Narrative:                  Spoke to patient at bedside , confirmed face sheet information . Patient from home with husband. No DME   Patient does not have a PCP . Interested in establishing care with Deatra James . NCM called office Dr Wynelle Link has no new patient appointments available. Called [facility] they have no new patients appointments available . Patient aware , she will think about it and decide who she would like to establish care with  Expected Discharge Plan: Home/Self Care Barriers to Discharge: Continued Medical Work up   Patient Goals and CMS Choice Patient states their goals for this hospitalization and ongoing recovery are:: to return to home          Expected Discharge Plan and Services   Discharge Planning Services: CM Consult   Living arrangements for the past 2 months: Single Family Home                 DME Arranged: N/A         HH Arranged: NA          Prior Living Arrangements/Services Living arrangements for the past 2 months: Single Family Home Lives with:: Spouse Patient language and need for interpreter reviewed:: Yes Do you feel safe going back to the place where you live?: Yes      Need for Family Participation in Patient Care: Yes (Comment) Care giver support system in place?: Yes (comment)   Criminal Activity/Legal Involvement Pertinent to Current Situation/Hospitalization: No - Comment as needed  Activities of Daily Living Home Assistive Devices/Equipment: None ADL Screening (condition at time of admission) Patient's cognitive ability adequate to safely complete daily activities?: Yes Is the patient deaf or have difficulty hearing?: No Does the patient have difficulty seeing, even when  wearing glasses/contacts?: No Does the patient have difficulty concentrating, remembering, or making decisions?: No Patient able to express need for assistance with ADLs?: Yes Does the patient have difficulty dressing or bathing?: No Independently performs ADLs?: Yes (appropriate for developmental age) Does the patient have difficulty walking or climbing stairs?: No Weakness of Legs: None Weakness of Arms/Hands: None  Permission Sought/Granted   Permission granted to share information with : No              Emotional Assessment Appearance:: Appears stated age Attitude/Demeanor/Rapport: Engaged Affect (typically observed): Accepting Orientation: : Oriented to Self, Oriented to Place, Oriented to  Time, Oriented to Situation Alcohol / Substance Use: Not Applicable Psych Involvement: No (comment)  Admission diagnosis:  Acute biliary pancreatitis [K85.10] Calculus of gallbladder with acute cholecystitis without obstruction [K80.00] Left upper quadrant abdominal pain [R10.12] Patient Active Problem List   Diagnosis Date Noted   Acute biliary pancreatitis 12/15/2022   Abnormal uterine bleeding (AUB) 03/31/2015   Fibroids 03/31/2015   S/P vaginal hysterectomy 03/31/2015   PCP:  Patient, No Pcp Per Pharmacy:   [facility] - [place], [place] - [address] [place] [address] Phone: [phone] Fax: [phone]  [facility] [address] [place] - [address] [place] [address] Phone: [phone] Fax: [phone]     Social Determinants of  Health (SDOH) Social History: SDOH Screenings   Food Insecurity: No Food Insecurity (12/16/2022)  Housing: Low Risk  (12/16/2022)  Transportation Needs: No Transportation Needs (12/16/2022)  Utilities: Not At Risk (12/16/2022)  Tobacco Use: Medium Risk (12/15/2022)   SDOH Interventions:     Readmission Risk Interventions     No data to  display

## 2022-12-20 NOTE — Plan of Care (Signed)

## 2022-12-20 NOTE — Progress Notes (Addendum)
Date and time results received: 12/20/22 2:35 AM  (use smartphrase ".now" to insert current time)  Test: Potassium Critical Value: 2.7  Name of Provider Notified: [facility] answering service  Orders Received? Or Actions Taken?: new orders placed

## 2022-12-21 ENCOUNTER — Inpatient Hospital Stay (HOSPITAL_COMMUNITY): Payer: BC Managed Care – PPO

## 2022-12-21 ENCOUNTER — Inpatient Hospital Stay (HOSPITAL_COMMUNITY): Payer: BC Managed Care – PPO | Admitting: Anesthesiology

## 2022-12-21 ENCOUNTER — Encounter (HOSPITAL_COMMUNITY): Payer: Self-pay

## 2022-12-21 ENCOUNTER — Other Ambulatory Visit: Payer: Self-pay

## 2022-12-21 ENCOUNTER — Encounter (HOSPITAL_COMMUNITY): Admission: EM | Disposition: A | Discharge status: Home / Self Care | Payer: Self-pay | Source: Home / Self Care

## 2022-12-21 HISTORY — PX: CHOLECYSTECTOMY: SHX55

## 2022-12-21 LAB — COMPREHENSIVE METABOLIC PANEL
ALT: 35 U/L (ref 0–44)
AST: 48 U/L — ABNORMAL HIGH (ref 15–41)
Albumin: 2.7 g/dL — ABNORMAL LOW (ref 3.5–5.0)
Alkaline Phosphatase: 60 U/L (ref 38–126)
Anion gap: 10 (ref 5–15)
BUN: <5 mg/dL — ABNORMAL LOW (ref 6–20)
CO2: 25 mmol/L (ref 22–32)
Calcium: 8.5 mg/dL — ABNORMAL LOW (ref 8.9–10.3)
Chloride: 98 mmol/L (ref 98–111)
Creatinine, Ser: 0.57 mg/dL (ref 0.44–1.00)
GFR, Estimated: >60 mL/min (ref >60)
Glucose, Bld: 118 mg/dL — ABNORMAL HIGH (ref 70–99)
Potassium: 3.2 mmol/L — ABNORMAL LOW (ref 3.5–5.1)
Sodium: 133 mmol/L — ABNORMAL LOW (ref 135–145)
Total Bilirubin: 0.7 mg/dL (ref 0.3–1.2)
Total Protein: 5.6 g/dL — ABNORMAL LOW (ref 6.5–8.1)

## 2022-12-21 LAB — SURGICAL PCR SCREEN
MRSA, PCR: NEGATIVE
Staphylococcus aureus: NEGATIVE

## 2022-12-21 SURGERY — LAPAROSCOPIC CHOLECYSTECTOMY
Anesthesia: General | Site: Abdomen

## 2022-12-21 MED ORDER — CHLORHEXIDINE GLUCONATE 0.12 % MT SOLN: 15.0000 mL | Freq: Once | OROMUCOSAL | Status: AC

## 2022-12-21 MED ORDER — MIDAZOLAM HCL 5 MG/5ML IJ SOLN
INTRAMUSCULAR | Status: DC | PRN
  Administered 2022-12-21: 2 mg via INTRAVENOUS

## 2022-12-21 MED ORDER — PROPOFOL 10 MG/ML IV BOLUS
INTRAVENOUS | Status: DC | PRN
  Administered 2022-12-21: 150 mg via INTRAVENOUS
  Administered 2022-12-21: 50 mg via INTRAVENOUS

## 2022-12-21 MED ORDER — SODIUM CHLORIDE 0.9 % IV SOLN: INTRAVENOUS | Status: DC | PRN

## 2022-12-21 MED ORDER — HYALURONIDASE HUMAN 150 UNIT/ML IJ SOLN
150.0000 [IU] | Freq: Once | INTRAMUSCULAR | Status: DC
  Filled 2022-12-21: qty 1

## 2022-12-21 MED ORDER — ONDANSETRON HCL 4 MG/2ML IJ SOLN
INTRAMUSCULAR | Status: DC | PRN
  Administered 2022-12-21: 4 mg via INTRAVENOUS

## 2022-12-21 MED ORDER — BUPIVACAINE-EPINEPHRINE 0.25% -1:200000 IJ SOLN
INTRAMUSCULAR | Status: DC | PRN
  Administered 2022-12-21: 20 mL

## 2022-12-21 MED ORDER — INDIGOTINDISULFONATE SODIUM 8 MG/ML IJ SOLN
INTRAMUSCULAR | Status: DC | PRN
  Administered 2022-12-21: 2.5 mg via INTRAVENOUS

## 2022-12-21 MED ORDER — LIDOCAINE 2% (20 MG/ML) 5 ML SYRINGE
INTRAMUSCULAR | Status: DC | PRN
  Administered 2022-12-21: 100 mg via INTRAVENOUS

## 2022-12-21 MED ORDER — FENTANYL CITRATE (PF) 100 MCG/2ML IJ SOLN: 25.0000 ug | INTRAMUSCULAR | Status: DC | PRN

## 2022-12-21 MED ORDER — POTASSIUM CHLORIDE 10 MEQ/100ML IV SOLN
10.0000 meq | INTRAVENOUS | Status: AC
  Administered 2022-12-21 (×3): 10 meq via INTRAVENOUS
  Filled 2022-12-21 (×3): qty 100

## 2022-12-21 MED ORDER — OXYCODONE HCL 5 MG/5ML PO SOLN: 5.0000 mg | Freq: Once | ORAL | Status: DC | PRN

## 2022-12-21 MED ORDER — ROCURONIUM BROMIDE 10 MG/ML (PF) SYRINGE
PREFILLED_SYRINGE | INTRAVENOUS | Status: DC | PRN
  Administered 2022-12-21: 10 mg via INTRAVENOUS
  Administered 2022-12-21: 60 mg via INTRAVENOUS

## 2022-12-21 MED ORDER — LACTATED RINGERS IV SOLN: INTRAVENOUS | Status: DC

## 2022-12-21 MED ORDER — ROCURONIUM BROMIDE 10 MG/ML (PF) SYRINGE
PREFILLED_SYRINGE | INTRAVENOUS | Status: AC
  Filled 2022-12-21: qty 10

## 2022-12-21 MED ORDER — OXYCODONE HCL 5 MG PO TABS: 5.0000 mg | ORAL_TABLET | Freq: Once | ORAL | Status: DC | PRN

## 2022-12-21 MED ORDER — ORAL CARE MOUTH RINSE: 15.0000 mL | Freq: Once | OROMUCOSAL | Status: AC

## 2022-12-21 MED ORDER — FENTANYL CITRATE (PF) 250 MCG/5ML IJ SOLN
INTRAMUSCULAR | Status: AC
  Filled 2022-12-21: qty 5

## 2022-12-21 MED ORDER — ONDANSETRON HCL 4 MG/2ML IJ SOLN: 4.0000 mg | Freq: Four times a day (QID) | INTRAMUSCULAR | Status: DC | PRN

## 2022-12-21 MED ORDER — BUPIVACAINE-EPINEPHRINE (PF) 0.25% -1:200000 IJ SOLN
INTRAMUSCULAR | Status: AC
  Filled 2022-12-21: qty 30

## 2022-12-21 MED ORDER — 0.9 % SODIUM CHLORIDE (POUR BTL) OPTIME
TOPICAL | Status: DC | PRN
  Administered 2022-12-21: 1000 mL

## 2022-12-21 MED ORDER — MIDAZOLAM HCL 2 MG/2ML IJ SOLN
INTRAMUSCULAR | Status: AC
  Filled 2022-12-21: qty 2

## 2022-12-21 MED ORDER — SODIUM CHLORIDE 0.9 % IR SOLN
Status: DC | PRN
  Administered 2022-12-21: 1000 mL

## 2022-12-21 MED ORDER — SUGAMMADEX SODIUM 200 MG/2ML IV SOLN
INTRAVENOUS | Status: DC | PRN
  Administered 2022-12-21: 200 mg via INTRAVENOUS

## 2022-12-21 MED ORDER — CHLORHEXIDINE GLUCONATE 0.12 % MT SOLN
OROMUCOSAL | Status: AC
  Administered 2022-12-21: 15 mL via OROMUCOSAL
  Filled 2022-12-21: qty 15

## 2022-12-21 MED ORDER — DEXAMETHASONE SODIUM PHOSPHATE 10 MG/ML IJ SOLN
INTRAMUSCULAR | Status: DC | PRN
  Administered 2022-12-21: 5 mg via INTRAVENOUS

## 2022-12-21 MED ORDER — FENTANYL CITRATE (PF) 250 MCG/5ML IJ SOLN
INTRAMUSCULAR | Status: DC | PRN
  Administered 2022-12-21 (×3): 50 ug via INTRAVENOUS
  Administered 2022-12-21: 100 ug via INTRAVENOUS

## 2022-12-21 MED ORDER — PROPOFOL 10 MG/ML IV BOLUS
INTRAVENOUS | Status: AC
  Filled 2022-12-21: qty 20

## 2022-12-21 SURGICAL SUPPLY — 42 items
ADH SKN CLS APL DERMABOND .7 (GAUZE/BANDAGES/DRESSINGS) ×1
APL PRP STRL LF DISP 70% ISPRP (MISCELLANEOUS) ×1
APPLIER CLIP 5 13 M/L LIGAMAX5 (MISCELLANEOUS) ×1
APR CLP MED LRG 5 ANG JAW (MISCELLANEOUS) ×1
BAG COUNTER SPONGE SURGICOUNT (BAG) ×1 IMPLANT
BAG SPNG CNTER NS LX DISP (BAG)
BLADE CLIPPER SURG (BLADE) IMPLANT
CANISTER SUCT 3000ML PPV (MISCELLANEOUS) ×1 IMPLANT
CHLORAPREP W/TINT 26 (MISCELLANEOUS) ×1 IMPLANT
CLIP APPLIE 5 13 M/L LIGAMAX5 (MISCELLANEOUS) ×1 IMPLANT
CNTNR URN SCR LID CUP LEK RST (MISCELLANEOUS) IMPLANT
CONT SPEC 4OZ STRL OR WHT (MISCELLANEOUS) ×1
COVER SURGICAL LIGHT HANDLE (MISCELLANEOUS) ×1 IMPLANT
DERMABOND ADVANCED .7 DNX12 (GAUZE/BANDAGES/DRESSINGS) ×1 IMPLANT
DISSECTOR BLUNT TIP ENDO 5MM (MISCELLANEOUS) IMPLANT
ELECT REM PT RETURN 9FT ADLT (ELECTROSURGICAL) ×1
ELECTRODE REM PT RTRN 9FT ADLT (ELECTROSURGICAL) ×1 IMPLANT
GLOVE BIO SURGEON STRL SZ7.5 (GLOVE) ×1 IMPLANT
GLOVE INDICATOR 8.0 STRL GRN (GLOVE) ×1 IMPLANT
GOWN STRL REUS W/ TWL LRG LVL3 (GOWN DISPOSABLE) ×2 IMPLANT
GOWN STRL REUS W/ TWL XL LVL3 (GOWN DISPOSABLE) ×1 IMPLANT
GOWN STRL REUS W/TWL LRG LVL3 (GOWN DISPOSABLE) ×3
GOWN STRL REUS W/TWL XL LVL3 (GOWN DISPOSABLE) ×1
IRRIG SUCT STRYKERFLOW 2 WTIP (MISCELLANEOUS) ×1
IRRIGATION SUCT STRKRFLW 2 WTP (MISCELLANEOUS) ×1 IMPLANT
KIT BASIN OR (CUSTOM PROCEDURE TRAY) ×1 IMPLANT
KIT IMAGING PINPOINTPAQ (MISCELLANEOUS) IMPLANT
KIT TURNOVER KIT B (KITS) ×1 IMPLANT
NS IRRIG 1000ML POUR BTL (IV SOLUTION) ×1 IMPLANT
PAD ARMBOARD 7.5X6 YLW CONV (MISCELLANEOUS) ×1 IMPLANT
PENCIL BUTTON HOLSTER BLD 10FT (ELECTRODE) ×1 IMPLANT
SCISSORS LAP 5X35 DISP (ENDOMECHANICALS) ×1 IMPLANT
SET TUBE SMOKE EVAC HIGH FLOW (TUBING) ×1 IMPLANT
SUT MNCRL AB 4-0 PS2 18 (SUTURE) ×1 IMPLANT
SYS BAG RETRIEVAL 10MM (BASKET) ×1
SYSTEM BAG RETRIEVAL 10MM (BASKET) IMPLANT
TOWEL GREEN STERILE FF (TOWEL DISPOSABLE) ×1 IMPLANT
TRAY LAPAROSCOPIC MC (CUSTOM PROCEDURE TRAY) ×1 IMPLANT
TROCAR ADV FIXATION 5X100MM (TROCAR) ×3 IMPLANT
TROCAR BALLN 12MMX100 BLUNT (TROCAR) ×1 IMPLANT
WARMER LAPAROSCOPE (MISCELLANEOUS) ×1 IMPLANT
WATER STERILE IRR 1000ML POUR (IV SOLUTION) ×1 IMPLANT

## 2022-12-21 NOTE — Progress Notes (Addendum)
Patient complaining of pain on IV site where IV potassium is infusing. Stop infision, call IV team to restart IV line. Ice compress applied. Pharmacy made aware of IV infiltration per IV nurse advised.

## 2022-12-21 NOTE — Transfer of Care (Signed)
Immediate Anesthesia Transfer of Care Note  Patient: Kimberly [name]  Procedure(s) Performed: LAPAROSCOPIC CHOLECYSTECTOMY WITH ICG DYE (Abdomen)  Patient Location: PACU  Anesthesia Type:General  Level of Consciousness: drowsy and patient cooperative  Airway & Oxygen Therapy: Patient Spontanous Breathing and Patient connected to nasal cannula oxygen  Post-op Assessment: Report given to RN and Post -op Vital signs reviewed and stable  Post vital signs: Reviewed and stable  Last Vitals:  Vitals Value Taken Time  BP 132/69 12/21/22 1420  Temp    Pulse 95 12/21/22 1421  Resp 17 12/21/22 1421  SpO2 94 % 12/21/22 1421  Vitals shown include unvalidated device data.  Last Pain:  Vitals:   12/21/22 1134  TempSrc:   PainSc: 0-No pain         Complications: No notable events documented.

## 2022-12-21 NOTE — Progress Notes (Signed)
Progress Note     Subjective: Doing well.  No complaints.  Denies any abdominal pain, nausea.  Reports that she feels her abdomen is back to at or near baseline from a distention standpoint.  Objective: Vital signs in last 24 hours: Temp:  [98.5 F (36.9 C)-99.2 F (37.3 C)] 99.2 F (37.3 C) (06/18 0421) Pulse Rate:  [81-84] 81 (06/18 0421) Resp:  [16-18] 17 (06/18 0421) BP: (139-144)/(84-96) 144/92 (06/18 0421) SpO2:  [93 %-97 %] 93 % (06/18 0421) Last BM Date : 12/19/22  Intake/Output from previous day: 06/17 0701 - 06/18 0700 In: 430.7 [IV Piggyback:430.7] Out: 1001 [Urine:1000; Stool:1] Intake/Output this shift: No intake/output data recorded.  PE: General: pleasant, comfortable HEENT: sclera anicteric  Heart: rrr Lungs: Respiratory effort nonlabored ORA Abd: soft, nontender;  not significantly distended Psych: A&Ox3 with an appropriate affect.    Lab Results:  Recent Labs    12/19/22 0013  WBC 9.0  HGB 13.7  HCT 40.3  PLT 114*   BMET Recent Labs    12/19/22 0013 12/20/22 0013  NA 139 134*  K 2.7* 2.7*  CL 99 97*  CO2 26 26  GLUCOSE 94 99  BUN <5* 5*  CREATININE 0.58 0.60  CALCIUM 8.6* 8.8*   PT/INR No results for input(s): "LABPROT", "INR" in the last 72 hours. CMP     Component Value Date/Time   NA 134 (L) 12/20/2022 0013   K 2.7 (LL) 12/20/2022 0013   CL 97 (L) 12/20/2022 0013   CO2 26 12/20/2022 0013   GLUCOSE 99 12/20/2022 0013   BUN 5 (L) 12/20/2022 0013   CREATININE 0.60 12/20/2022 0013   CALCIUM 8.8 (L) 12/20/2022 0013   PROT 6.0 (L) 12/20/2022 0013   ALBUMIN 3.0 (L) 12/20/2022 0013   AST 50 (H) 12/20/2022 0013   ALT 33 12/20/2022 0013   ALKPHOS 70 12/20/2022 0013   BILITOT 1.0 12/20/2022 0013   GFRNONAA >60 12/20/2022 0013   Lipase     Component Value Date/Time   LIPASE 93 (H) 12/20/2022 0013       Studies/Results: No results found.  Anti-infectives: Anti-infectives (From admission, onward)    Start      Dose/Rate Route Frequency Ordered Stop   12/17/22 1230  cefTRIAXone (ROCEPHIN) 2 g in sodium chloride 0.9 % 100 mL IVPB       Note to Pharmacy: Pharmacy may adjust dosing strength / duration / interval for maximal efficacy   2 g 200 mL/hr over 30 Minutes Intravenous Every 24 hours 12/17/22 1130          Assessment/Plan  Biliary pancreatitis  - RUQ U/S 6/13 with gallstone in the neck of the gallbladder, no signs of cholecystitis  - MRCP 6/14 negative for choledocho. Showed significant interstitial pancreatitis w/o necrosis or pseudocyst. 17 mm gallstone towards the neck of the gallbladder without biliary dilation. - Distention ~resolved - NPO - planning cholecystectomy today - CXR 6/16 possible LL pneumonia - on Rocephin - Hypokalemia - replace again today - she is getting IV + PO replacement - AST/ALT/Alk phos ~WNL. Bili normalized; pain free - Hypokalemia - replacing with addn'l KCl this morning  BID miralax VTE: SQH ID: Rocephin for possible cholecystitis   -The anatomy and physiology of the hepatobiliary system was discussed with the patient. The pathophysiology of gallbladder disease was then reviewed as well. -The options for treatment were discussed including ongoing observation which may result in subsequent gallbladder complications (infection, recurrent pancreatitis/choledocholithiasis, etc), drainage procedures, and surgery -  laparoscopic cholecystectomy with indocyanine green cholangiography, possible cholangiogram based on intraoperative findings -The planned procedure, material risks (including, but not limited to, pain, bleeding, infection, scarring, need for blood transfusion, damage to surrounding structures- blood vessels/nerves/viscus/organs, damage to bile duct, bile leak, chronic diarrhea, conversion to a 'subtotal' cholecystectomy and general expectations therein, post-cholecystectomy diarrhea, potential need for additional procedures including EGD/ERCP, hernia,  worsening of pre-existing medical conditions, pancreatitis, pneumonia, heart attack, stroke, death) benefits and alternatives to surgery were discussed at length. We have noted a good probability that the procedure would help improve their symptoms. The patient's questions were answered to her satisfaction, she voiced understanding and elected to proceed with surgery. Additionally, we discussed typical postoperative expectations and the recovery process.  Dispo: med-surg, supportive care for pancreatitis.  I spent a total of 36 minutes in both face-to-face and non-face-to-face activities, excluding procedures performed, for this visit on the date of this encounter.  Marin Olp, MD [facility], [facility]

## 2022-12-21 NOTE — Anesthesia Preprocedure Evaluation (Addendum)
Anesthesia Evaluation  Patient identified by MRN, date of birth, ID band Patient awake    Reviewed: Allergy & Precautions, NPO status , Patient's Chart, lab work & pertinent test results  History of Anesthesia Complications Negative for: history of anesthetic complications  Airway Mallampati: II  TM Distance: >3 FB Neck ROM: Full    Dental no notable dental hx. (+) Dental Advisory Given, Teeth Intact   Pulmonary former smoker   Pulmonary exam normal breath sounds clear to auscultation       Cardiovascular negative cardio ROS Normal cardiovascular exam Rhythm:Regular Rate:Normal     Neuro/Psych negative neurological ROS  negative psych ROS   GI/Hepatic negative GI ROS, Neg liver ROS,,,  Endo/Other  negative endocrine ROS    Renal/GU negative Renal ROS  negative genitourinary   Musculoskeletal negative musculoskeletal ROS (+)    Abdominal   Peds negative pediatric ROS (+)  Hematology negative hematology ROS (+)   Anesthesia Other Findings   Reproductive/Obstetrics negative OB ROS                             Anesthesia Physical Anesthesia Plan  ASA: 2  Anesthesia Plan: General   Post-op Pain Management: Minimal or no pain anticipated, Toradol IV (intra-op)* and Ofirmev IV (intra-op)*   Induction: Intravenous and Cricoid pressure planned  PONV Risk Score and Plan: 3 and Ondansetron, Dexamethasone and Treatment may vary due to age or medical condition  Airway Management Planned: Oral ETT  Additional Equipment: None  Intra-op Plan:   Post-operative Plan: Extubation in OR  Informed Consent: I have reviewed the patients History and Physical, chart, labs and discussed the procedure including the risks, benefits and alternatives for the proposed anesthesia with the patient or authorized representative who has indicated his/her understanding and acceptance.     Dental advisory  given  Plan Discussed with: CRNA and Anesthesiologist  Anesthesia Plan Comments:         Anesthesia Quick Evaluation

## 2022-12-21 NOTE — Op Note (Signed)
12/15/2022 - 12/21/2022 2:03 PM  PATIENT: Kimberly [name]  52 y.o. female  Patient Care Team: Patient, No Pcp Per as PCP - General (General Practice)  PRE-OPERATIVE DIAGNOSIS: Biliary pancreatitis  POST-OPERATIVE DIAGNOSIS: Same  PROCEDURE: Laparoscopic cholecystectomy with indocyanine green cholangiography  SURGEON: Marin Olp, MD  ASSISTANT: OR staff  ANESTHESIA: General endotracheal  EBL: 15 mL  DRAINS: None  SPECIMEN: Gallbladder  COUNTS: Sponge, needle and instrument counts were reported correct x2 at the conclusion of the operation  DISPOSITION: PACU in satisfactory condition  COMPLICATIONS: None  FINDINGS: Moderately inflamed/edematous pericholecystic tissues consistent with recent biliary pancreatitis.  ICG cholangiography demonstrates opacification of the cystic duct and gallbladder.  Faint tracer seen up towards the porta hepatis of which no dissection was done.  Faint tracer seen through the wall of the duodenum consistent with a patent biliary system.  Critical view of safety was achieved prior to clipping or dividing any structures.  Diminutive posterior cystic artery controlled with clips as well.  DESCRIPTION:  The patient was identified & brought into the operating room. She was then positioned supine on the OR table. SCDs were in place and active during the entire case. She then underwent general endotracheal anesthesia. Pressure points were padded. Hair on the abdomen was clipped by the OR team. The abdomen was prepped and draped in the standard sterile fashion. Antibiotics were administered. A surgical timeout was performed and confirmed our plan.   A periumbilical incision was made. The umbilical stalk was grasped and retracted outwardly. The supraumbilical fascia was identified and incised. The peritoneal cavity was gently entered bluntly. A purse-string 0 Vicryl suture was placed. The Hasson cannula was inserted into the peritoneal cavity and insufflation  with CO2 commenced to . A laparoscope was inserted into the peritoneal cavity and inspection confirmed no evidence of trocar site complications. The patient was then positioned in reverse Trendelenburg with slight left side down. 3 additional 5mm trocars were placed along the right subcostal line - one 5mm port in mid subcostal region, another 5mm port in the right flank near the anterior axillary line, and a third 5mm port in the left subxiphoid region obliquely near the falciform ligament.  The liver and gallbladder were inspected.  Gallbladder is significantly inflamed in appearance due to edema.  All consistent with recent biliary pancreatitis.  Stomach and anterior wall of the duodenum were all normal in appearance. The gallbladder fundus was grasped and elevated cephalad. An additional grasper was then placed on the infundibulum of the gallbladder and the infundibulum was retracted laterally. Staying high on the gallbladder, the peritoneum on both sides of the gallbladder was opened with hook cautery. Gentle blunt dissection was then employed with a Art gallery manager working down into [employer]. The cystic duct was identified and carefully circumferentially dissected. The cystic artery was also identified and carefully circumferentially dissected. The space between the cystic artery and hepatocystic plate was developed such that a good view of the liver could be seen through a window medial to the cystic artery. The triangle of Calot had been cleared of all fibrofatty tissue. At this point, a critical view of safety was achieved and the only structures visualized was the skeletonized cystic duct laterally, the skeletonized cystic artery and the liver through the window medial to the artery. A small diminutive posterior cystic artery was noted  Under near-infrared light, indocyanine green cholangiography demonstrates filling of the cystic duct and gallbladder.  Faint tracer seen up near the  porta hepatis.  There  is also faint tracer seen to the wall of the duodenum consistent with a patent biliary system.  The cystic duct and artery were clipped with 2 clips on the patient side and 1 clip on the specimen side.  The manage if posterior cystic artery was also controlled in a similar manner.  The cystic duct and artery were then divided. The gallbladder was then freed from its remaining attachments to the liver using electrocautery and placed into an endocatch bag. The RUQ was gently irrigated with sterile saline. Hemostasis was then verified. The clips were in good position; the gallbladder fossa was dry. The rest of the abdomen was inspected no injury nor bleeding elsewhere was identified.  The endocatch bag containing the gallbladder was then removed from the umbilical port site and passed off as specimen. The RUQ ports were removed under direct visualization and noted to be hemostatic. The umbilical fascia was then closed using the 0 Vicryl purse-string suture. The fascia was palpated and noted to be completely closed. The skin of all incision sites was approximated with 4-0 monocryl subcuticular suture and dermabond applied.  She was then awakened from anesthesia, extubated, and transferred to a stretcher for transport to PACU in satisfactory condition.

## 2022-12-21 NOTE — Discharge Instructions (Signed)
[facility]  Please arrive at least 30 min before your appointment to complete your check in paperwork.  If you are unable to arrive 30 min prior to your appointment time we may have to cancel or reschedule you. LAPAROSCOPIC SURGERY: POST OP INSTRUCTIONS Always review your discharge instruction sheet given to you by the facility where your surgery was performed. IF YOU HAVE DISABILITY OR FAMILY LEAVE FORMS, YOU MUST BRING THEM TO THE OFFICE FOR PROCESSING.   DO NOT GIVE THEM TO YOUR DOCTOR.  PAIN CONTROL  First take acetaminophen (Tylenol) AND/or ibuprofen (Advil) to control your pain after surgery.  Follow directions on package.  Taking acetaminophen (Tylenol) and/or ibuprofen (Advil) regularly after surgery will help to control your pain and lower the amount of prescription pain medication you may need.  You should not take more than 4,000 mg (4 grams) of acetaminophen (Tylenol) in 24 hours.  You should not take ibuprofen (Advil), aleve, motrin, naprosyn or other NSAIDS if you have a history of stomach ulcers or chronic kidney disease.  A prescription for pain medication may be given to you upon discharge.  Take your pain medication as prescribed, if you still have uncontrolled pain after taking acetaminophen (Tylenol) or ibuprofen (Advil). Use ice packs to help control pain. If you need a refill on your pain medication, please contact your pharmacy.  They will contact our office to request authorization. Prescriptions will not be filled after 5pm or on week-ends.  HOME MEDICATIONS Take your usually prescribed medications unless otherwise directed.  DIET You should follow a light diet the first few days after arrival home.  Be sure to include lots of fluids daily. Avoid fatty, fried foods.   CONSTIPATION It is common to experience some constipation after surgery and if you are taking pain medication.  Increasing fluid intake and taking a stool softener (such as Colace)  will usually help or prevent this problem from occurring.  A mild laxative (Milk of Magnesia or Miralax) should be taken according to package instructions if there are no bowel movements after 48 hours.  WOUND/INCISION CARE Most patients will experience some swelling and bruising in the area of the incisions.  Ice packs will help.  Swelling and bruising can take several days to resolve.  Unless discharge instructions indicate otherwise, follow guidelines below  STERI-STRIPS - you may remove your outer bandages 48 hours after surgery, and you may shower at that time.  You have steri-strips (small skin tapes) in place directly over the incision.  These strips should be left on the skin for 7-10 days.   DERMABOND/SKIN GLUE - you may shower in 24 hours.  The glue will flake off over the next 2-3 weeks. Any sutures or staples will be removed at the office during your follow-up visit.  ACTIVITIES You may resume regular (light) daily activities beginning the next day--such as daily self-care, walking, climbing stairs--gradually increasing activities as tolerated.  You may have sexual intercourse when it is comfortable.  Refrain from any heavy lifting or straining until approved by your doctor. You may drive when you are no longer taking prescription pain medication, you can comfortably wear a seatbelt, and you can safely maneuver your car and apply brakes.  FOLLOW-UP You should see your doctor in the office for a follow-up appointment approximately 2-3 weeks after your surgery.  You should have been given your post-op/follow-up appointment when your surgery was scheduled.  If you did not receive a post-op/follow-up appointment, make sure   that you call for this appointment within a day or two after you arrive home to insure a convenient appointment time.   WHEN TO CALL YOUR DOCTOR: Fever over 101.0 Inability to urinate Continued bleeding from incision. Increased pain, redness, or drainage from the  incision. Increasing abdominal pain  The clinic staff is available to answer your questions during regular business hours.  Please don't hesitate to call and ask to speak to one of the nurses for clinical concerns.  If you have a medical emergency, go to the nearest emergency room or call 911.  A surgeon from [facility] is always on call at the hospital. [address], [address], [place], [place]  [address] ? [address] [address], , [place]   [address] [phone] ? [phone] ? FAX [phone]  

## 2022-12-21 NOTE — Anesthesia Procedure Notes (Signed)
Procedure Name: Intubation Date/Time: 12/21/2022 1:00 PM  Performed by: Waynard Edwards, CRNAPre-anesthesia Checklist: Patient identified, Emergency Drugs available, Suction available and Patient being monitored Patient Re-evaluated:Patient Re-evaluated prior to induction Oxygen Delivery Method: Circle system utilized Preoxygenation: Pre-oxygenation with 100% oxygen Induction Type: IV induction Ventilation: Mask ventilation without difficulty Laryngoscope Size: Miller and 2 Grade View: Grade I Tube type: Oral Tube size: 7.0 mm Number of attempts: 1 Airway Equipment and Method: Stylet Placement Confirmation: ETT inserted through vocal cords under direct vision, positive ETCO2 and breath sounds checked- equal and bilateral Secured at: 22 cm Tube secured with: Tape Dental Injury: Teeth and Oropharynx as per pre-operative assessment

## 2022-12-22 ENCOUNTER — Encounter (HOSPITAL_COMMUNITY): Payer: Self-pay | Admitting: Surgery

## 2022-12-22 LAB — COMPREHENSIVE METABOLIC PANEL
ALT: 106 U/L — ABNORMAL HIGH (ref 0–44)
AST: 216 U/L — ABNORMAL HIGH (ref 15–41)
Albumin: 2.9 g/dL — ABNORMAL LOW (ref 3.5–5.0)
Alkaline Phosphatase: 130 U/L — ABNORMAL HIGH (ref 38–126)
Anion gap: 12 (ref 5–15)
BUN: 6 mg/dL (ref 6–20)
CO2: 24 mmol/L (ref 22–32)
Calcium: 8.5 mg/dL — ABNORMAL LOW (ref 8.9–10.3)
Chloride: 98 mmol/L (ref 98–111)
Creatinine, Ser: 0.74 mg/dL (ref 0.44–1.00)
GFR, Estimated: >60 mL/min (ref >60)
Glucose, Bld: 145 mg/dL — ABNORMAL HIGH (ref 70–99)
Potassium: 4 mmol/L (ref 3.5–5.1)
Sodium: 134 mmol/L — ABNORMAL LOW (ref 135–145)
Total Bilirubin: 0.9 mg/dL (ref 0.3–1.2)
Total Protein: 6.1 g/dL — ABNORMAL LOW (ref 6.5–8.1)

## 2022-12-22 LAB — CBC WITH DIFFERENTIAL/PLATELET
Abs Immature Granulocytes: 0.1 10*3/uL — ABNORMAL HIGH (ref 0.00–0.07)
Basophils Absolute: 0 10*3/uL (ref 0.0–0.1)
Basophils Relative: 0 %
Eosinophils Absolute: 0 10*3/uL (ref 0.0–0.5)
Eosinophils Relative: 0 %
HCT: 39.9 % (ref 36.0–46.0)
Hemoglobin: 13.8 g/dL (ref 12.0–15.0)
Lymphocytes Relative: 2 %
Lymphs Abs: 0.2 10*3/uL — ABNORMAL LOW (ref 0.7–4.0)
MCH: 38.7 pg — ABNORMAL HIGH (ref 26.0–34.0)
MCHC: 34.6 g/dL (ref 30.0–36.0)
MCV: 111.8 fL — ABNORMAL HIGH (ref 80.0–100.0)
Metamyelocytes Relative: 1 %
Monocytes Absolute: 0.6 10*3/uL (ref 0.1–1.0)
Monocytes Relative: 7 %
Neutro Abs: 8.3 10*3/uL — ABNORMAL HIGH (ref 1.7–7.7)
Neutrophils Relative %: 90 %
Platelets: 134 10*3/uL — ABNORMAL LOW (ref 150–400)
RBC: 3.57 MIL/uL — ABNORMAL LOW (ref 3.87–5.11)
RDW: 14 % (ref 11.5–15.5)
WBC: 9.2 10*3/uL (ref 4.0–10.5)
nRBC: 0.3 % — ABNORMAL HIGH (ref 0.0–0.2)
nRBC: 1 /100 WBC — ABNORMAL HIGH

## 2022-12-22 LAB — SURGICAL PATHOLOGY

## 2022-12-22 MED ORDER — PANTOPRAZOLE SODIUM 40 MG PO TBEC
40.0000 mg | DELAYED_RELEASE_TABLET | Freq: Every day | ORAL | Status: DC
  Administered 2022-12-22: 40 mg via ORAL
  Filled 2022-12-22: qty 1

## 2022-12-22 NOTE — Plan of Care (Signed)

## 2022-12-22 NOTE — Progress Notes (Signed)
  Subjective No acute events.  Feeling quite well.  She denies any abdominal pain.  No nausea or vomiting.  Anxious to go home.  Objective: Vital signs in last 24 hours: Temp:  [97.7 F (36.5 C)-98.9 F (37.2 C)] 98.6 F (37 C) (06/20 0851) Pulse Rate:  [66-95] 85 (06/20 0851) Resp:  [17-21] 18 (06/20 0851) BP: (111-173)/(69-99) 152/92 (06/20 0851) SpO2:  [92 %-98 %] 98 % (06/20 0851) Last BM Date : 12/21/22  Intake/Output from previous day: 06/19 0701 - 06/20 0700 In: 900 [I.V.:900] Out: 25 [Blood:25] Intake/Output this shift: No intake/output data recorded.  Gen: NAD, comfortable CV: RRR Pulm: Normal work of breathing Abd: Soft, NT/ND .  Incisions clean/dry/intact without erythema. Ext: SCDs in place  Lab Results: CBC  Recent Labs    12/22/22 0013  WBC 9.2  HGB 13.8  HCT 39.9  PLT 134*   BMET Recent Labs    12/21/22 0623 12/22/22 0013  NA 133* 134*  K 3.2* 4.0  CL 98 98  CO2 25 24  GLUCOSE 118* 145*  BUN <5* 6  CREATININE 0.57 0.74  CALCIUM 8.5* 8.5*   PT/INR No results for input(s): "LABPROT", "INR" in the last 72 hours. ABG No results for input(s): "PHART", "HCO3" in the last 72 hours.  Invalid input(s): "PCO2", "PO2"  Studies/Results:  Anti-infectives: Anti-infectives (From admission, onward)    Start     Dose/Rate Route Frequency Ordered Stop   12/17/22 1230  cefTRIAXone (ROCEPHIN) 2 g in sodium chloride 0.9 % 100 mL IVPB       Note to Pharmacy: Pharmacy may adjust dosing strength / duration / interval for maximal efficacy   2 g 200 mL/hr over 30 Minutes Intravenous Every 24 hours 12/17/22 1130          Assessment/Plan: Patient Active Problem List   Diagnosis Date Noted   Acute biliary pancreatitis 12/15/2022   Abnormal uterine bleeding (AUB) 03/31/2015   Fibroids 03/31/2015   S/P vaginal hysterectomy 03/31/2015   s/p Procedure(s): LAPAROSCOPIC CHOLECYSTECTOMY WITH ICG DYE 12/21/2022  -We spent time reviewing her procedure,  findings, and plans. - Mild transaminitis, total bilirubin normal at 0.9. - Given known gallstone pancreatitis will plan to keep her today and repeat her LFTs in the morning.  If there downtrending appropriately, anticipate home tomorrow.  Otherwise, diet as tolerated.  N.p.o. after midnight tonight - All the above was reviewed with her.  Questions answered.  She expressed understanding and agreement with the plan.   LOS: 7 days   Marin Olp, MD [facility]

## 2022-12-22 NOTE — Plan of Care (Signed)
  Problem: Clinical Measurements: Goal: Cardiovascular complication will be avoided Outcome: Progressing   Problem: Activity: Goal: Risk for activity intolerance will decrease Outcome: Progressing   Problem: Nutrition: Goal: Adequate nutrition will be maintained Outcome: Progressing   Problem: Clinical Measurements: Goal: Respiratory complications will improve Outcome: Progressing   Problem: Clinical Measurements: Goal: Diagnostic test results will improve Outcome: Progressing   Problem: Clinical Measurements: Goal: Will remain free from infection Outcome: Progressing

## 2022-12-23 LAB — HEPATIC FUNCTION PANEL
ALT: 72 U/L — ABNORMAL HIGH (ref 0–44)
AST: 58 U/L — ABNORMAL HIGH (ref 15–41)
Albumin: 2.9 g/dL — ABNORMAL LOW (ref 3.5–5.0)
Alkaline Phosphatase: 87 U/L (ref 38–126)
Bilirubin, Direct: 0.2 mg/dL (ref 0.0–0.2)
Indirect Bilirubin: 0.2 mg/dL — ABNORMAL LOW (ref 0.3–0.9)
Total Bilirubin: 0.4 mg/dL (ref 0.3–1.2)
Total Protein: 6.5 g/dL (ref 6.5–8.1)

## 2022-12-23 MED ORDER — ACETAMINOPHEN 500 MG PO TABS: 1000.0000 mg | ORAL_TABLET | Freq: Four times a day (QID) | ORAL | 0 refills | Status: AC | PRN

## 2022-12-23 MED ORDER — OXYCODONE HCL 5 MG PO TABS: 5.0000 mg | ORAL_TABLET | ORAL | 0 refills | Status: DC | PRN

## 2022-12-23 NOTE — Plan of Care (Signed)

## 2022-12-23 NOTE — Discharge Summary (Signed)
[facility] Discharge Summary   Patient ID: Kimberly [name] MRN: [id number] DOB/AGE: July 31, 1970 52 y.o.  Admit date: 12/15/2022 Discharge date: 12/23/2022  Admitting Diagnosis: Acute biliary pancreatitis [K85.10] Calculus of gallbladder with acute cholecystitis without obstruction [K80.00] Left upper quadrant abdominal pain [R10.12]   Discharge Diagnosis Acute biliary pancreatitis [K85.10] Calculus of gallbladder with acute cholecystitis without obstruction [K80.00] Left upper quadrant abdominal pain [R10.12] S/p Laparoscopic Cholecystectomy with ICG dye  Consultants None  Imaging: No results found.  Procedures Dr. Cliffton Asters (12/21/22) - Laparoscopic Cholecystectomy with ICG dye  Hospital Course:  52 y.o. female who [facility] ED with abdominal pain.  Workup showed biliary pancreatitis.  Patient was admitted for bowel rest and IV fluids and pancreatitis monitored. Once pancreatitis improved she underwent procedure listed above.  Tolerated procedure well.  Diet was advanced as tolerated.  LFTs were monitored and stabilized. On POD2, the patient was voiding well, tolerating diet, ambulating well, pain well controlled, vital signs stable, incisions c/d/i and felt stable for discharge home.  Patient will follow up in our office in 3 weeks and knows to call with questions or concerns.  Physical Exam: General:  Alert, NAD, pleasant, comfortable Abd:  Soft, ND, mild tenderness, incisions C/D/I   Allergies as of 12/23/2022   No Known Allergies      Medication List     STOP taking these medications    docusate sodium 100 MG capsule Commonly known as: COLACE   megestrol 20 MG tablet Commonly known as: MEGACE   oxyCODONE-acetaminophen 5-325 MG tablet Commonly known as: PERCOCET/ROXICET       TAKE these medications    acetaminophen 500 MG tablet Commonly known as: TYLENOL Take 2 tablets (1,000 mg total) by mouth every 6 (six) hours as needed.   albuterol  108 (90 Base) MCG/ACT inhaler Commonly known as: VENTOLIN HFA Inhale 2 puffs into the lungs every 6 (six) hours as needed for wheezing or shortness of breath.   fluticasone 0.005 % ointment Commonly known as: CUTIVATE Apply 1 application  topically daily as needed (eczema).   ibuprofen 600 MG tablet Commonly known as: ADVIL Take 1 tablet (600 mg total) by mouth every 6 (six) hours as needed. What changed: Another medication with the same name was removed. Continue taking this medication, and follow the directions you see here.   oxyCODONE 5 MG immediate release tablet Commonly known as: Oxy IR/ROXICODONE Take 1 tablet (5 mg total) by mouth every 4 (four) hours as needed for moderate pain.          Follow-up Information     Maczis, Hedda Slade, PA-C Follow up on 01/10/2023.   Specialty: General Surgery Why: 10:45 am, Arrive 30 minutes prior to your appointment time, Please bring your insurance card and photo ID Contact information: [address] [place] [address] [facility] [place] [address] [phone]                 Signed: Letha Cape, [facility] 12/23/2022, 8:36 AM Please see Amion for pager number during day hours 7:00am-4:30pm

## 2022-12-23 NOTE — Progress Notes (Signed)
Patient being discharged home, self-care. PIV removed. AVS discussed, pt verbalized understanding. Patient escorted via WC off the unit in NAD with all belongings.

## 2022-12-23 NOTE — Anesthesia Postprocedure Evaluation (Signed)
Anesthesia Post Note  Patient: Kimberly [name]  Procedure(s) Performed: LAPAROSCOPIC CHOLECYSTECTOMY WITH ICG DYE (Abdomen)     Patient location during evaluation: PACU Anesthesia Type: General Level of consciousness: awake and alert Pain management: pain level controlled Vital Signs Assessment: post-procedure vital signs reviewed and stable Respiratory status: spontaneous breathing, nonlabored ventilation, respiratory function stable and patient connected to nasal cannula oxygen Cardiovascular status: blood pressure returned to baseline and stable Postop Assessment: no apparent nausea or vomiting Anesthetic complications: no   No notable events documented.  Last Vitals:  Vitals:   12/22/22 2140 12/23/22 0642  BP: (!) 148/93 (!) 156/88  Pulse: 79 77  Resp:    Temp: 37.2 C 37 C  SpO2: 99% 97%    Last Pain:  Vitals:   12/23/22 0642  TempSrc: Oral  PainSc:                  Jkayla Spiewak S

## 2023-09-11 ENCOUNTER — Encounter (HOSPITAL_COMMUNITY): Payer: Self-pay | Admitting: Family Medicine

## 2023-09-11 ENCOUNTER — Emergency Department (HOSPITAL_COMMUNITY)

## 2023-09-11 ENCOUNTER — Inpatient Hospital Stay (HOSPITAL_COMMUNITY)

## 2023-09-11 ENCOUNTER — Inpatient Hospital Stay (HOSPITAL_COMMUNITY)
Admission: EM | Admit: 2023-09-11 | Discharge: 2023-09-14 | DRG: 440 | Disposition: A | Discharge status: Home / Self Care | Attending: Internal Medicine | Admitting: Internal Medicine

## 2023-09-11 ENCOUNTER — Other Ambulatory Visit: Payer: Self-pay

## 2023-09-11 DIAGNOSIS — K209 Esophagitis, unspecified without bleeding: Secondary | ICD-10-CM | POA: Diagnosis present

## 2023-09-11 DIAGNOSIS — K8681 Exocrine pancreatic insufficiency: Secondary | ICD-10-CM | POA: Diagnosis present

## 2023-09-11 DIAGNOSIS — Z9049 Acquired absence of other specified parts of digestive tract: Secondary | ICD-10-CM

## 2023-09-11 DIAGNOSIS — Z87891 Personal history of nicotine dependence: Secondary | ICD-10-CM | POA: Diagnosis not present

## 2023-09-11 DIAGNOSIS — Z7141 Alcohol abuse counseling and surveillance of alcoholic: Secondary | ICD-10-CM | POA: Diagnosis not present

## 2023-09-11 DIAGNOSIS — D6959 Other secondary thrombocytopenia: Secondary | ICD-10-CM | POA: Diagnosis present

## 2023-09-11 DIAGNOSIS — Z8719 Personal history of other diseases of the digestive system: Secondary | ICD-10-CM | POA: Diagnosis not present

## 2023-09-11 DIAGNOSIS — L309 Dermatitis, unspecified: Secondary | ICD-10-CM | POA: Diagnosis present

## 2023-09-11 DIAGNOSIS — E876 Hypokalemia: Secondary | ICD-10-CM | POA: Diagnosis present

## 2023-09-11 DIAGNOSIS — K700 Alcoholic fatty liver: Secondary | ICD-10-CM | POA: Diagnosis present

## 2023-09-11 DIAGNOSIS — F1010 Alcohol abuse, uncomplicated: Secondary | ICD-10-CM | POA: Diagnosis present

## 2023-09-11 DIAGNOSIS — K852 Alcohol induced acute pancreatitis without necrosis or infection: Principal | ICD-10-CM | POA: Diagnosis present

## 2023-09-11 DIAGNOSIS — K859 Acute pancreatitis without necrosis or infection, unspecified: Principal | ICD-10-CM | POA: Diagnosis present

## 2023-09-11 DIAGNOSIS — Z79899 Other long term (current) drug therapy: Secondary | ICD-10-CM | POA: Diagnosis not present

## 2023-09-11 DIAGNOSIS — D696 Thrombocytopenia, unspecified: Secondary | ICD-10-CM | POA: Diagnosis present

## 2023-09-11 DIAGNOSIS — Z8249 Family history of ischemic heart disease and other diseases of the circulatory system: Secondary | ICD-10-CM | POA: Diagnosis not present

## 2023-09-11 DIAGNOSIS — K760 Fatty (change of) liver, not elsewhere classified: Secondary | ICD-10-CM | POA: Diagnosis present

## 2023-09-11 DIAGNOSIS — Z90710 Acquired absence of both cervix and uterus: Secondary | ICD-10-CM

## 2023-09-11 DIAGNOSIS — K7010 Alcoholic hepatitis without ascites: Secondary | ICD-10-CM | POA: Diagnosis present

## 2023-09-11 DIAGNOSIS — F1090 Alcohol use, unspecified, uncomplicated: Secondary | ICD-10-CM | POA: Diagnosis present

## 2023-09-11 DIAGNOSIS — K860 Alcohol-induced chronic pancreatitis: Secondary | ICD-10-CM | POA: Diagnosis present

## 2023-09-11 DIAGNOSIS — K8689 Other specified diseases of pancreas: Secondary | ICD-10-CM | POA: Diagnosis present

## 2023-09-11 DIAGNOSIS — T510X4A Toxic effect of ethanol, undetermined, initial encounter: Secondary | ICD-10-CM | POA: Diagnosis present

## 2023-09-11 LAB — CBC
HCT: 45.2 % (ref 36.0–46.0)
Hemoglobin: 15.6 g/dL — ABNORMAL HIGH (ref 12.0–15.0)
MCH: 35.9 pg — ABNORMAL HIGH (ref 26.0–34.0)
MCHC: 34.5 g/dL (ref 30.0–36.0)
MCV: 104.1 fL — ABNORMAL HIGH (ref 80.0–100.0)
Platelets: 148 10*3/uL — ABNORMAL LOW (ref 150–400)
RBC: 4.34 MIL/uL (ref 3.87–5.11)
RDW: 13.4 % (ref 11.5–15.5)
WBC: 23.9 10*3/uL — ABNORMAL HIGH (ref 4.0–10.5)
nRBC: 0 % (ref 0.0–0.2)

## 2023-09-11 LAB — URINALYSIS, ROUTINE W REFLEX MICROSCOPIC
Glucose, UA: 50 mg/dL — AB
Hgb urine dipstick: NEGATIVE
Ketones, ur: 5 mg/dL — AB
Leukocytes,Ua: NEGATIVE
Nitrite: NEGATIVE
Protein, ur: 100 mg/dL — AB
Specific Gravity, Urine: 1.03 (ref 1.005–1.030)
pH: 5 (ref 5.0–8.0)

## 2023-09-11 LAB — COMPREHENSIVE METABOLIC PANEL
ALT: 51 U/L — ABNORMAL HIGH (ref 0–44)
AST: 53 U/L — ABNORMAL HIGH (ref 15–41)
Albumin: 3.6 g/dL (ref 3.5–5.0)
Alkaline Phosphatase: 55 U/L (ref 38–126)
Anion gap: 19 — ABNORMAL HIGH (ref 5–15)
BUN: 11 mg/dL (ref 6–20)
CO2: 22 mmol/L (ref 22–32)
Calcium: 9.5 mg/dL (ref 8.9–10.3)
Chloride: 97 mmol/L — ABNORMAL LOW (ref 98–111)
Creatinine, Ser: 0.76 mg/dL (ref 0.44–1.00)
GFR, Estimated: >60 mL/min (ref >60)
Glucose, Bld: 163 mg/dL — ABNORMAL HIGH (ref 70–99)
Potassium: 3 mmol/L — ABNORMAL LOW (ref 3.5–5.1)
Sodium: 138 mmol/L (ref 135–145)
Total Bilirubin: 2.4 mg/dL — ABNORMAL HIGH (ref 0.0–1.2)
Total Protein: 7 g/dL (ref 6.5–8.1)

## 2023-09-11 LAB — I-STAT CG4 LACTIC ACID, ED: Lactic Acid, Venous: 1.9 mmol/L (ref 0.5–1.9)

## 2023-09-11 LAB — HCG, SERUM, QUALITATIVE: Preg, Serum: NEGATIVE

## 2023-09-11 LAB — LIPASE, BLOOD: Lipase: 145 U/L — ABNORMAL HIGH (ref 11–51)

## 2023-09-11 MED ORDER — ONDANSETRON HCL 4 MG/2ML IJ SOLN
4.0000 mg | Freq: Once | INTRAMUSCULAR | Status: AC
  Administered 2023-09-11: 4 mg via INTRAVENOUS
  Filled 2023-09-11: qty 2

## 2023-09-11 MED ORDER — SODIUM CHLORIDE 0.9% FLUSH
10.0000 mL | Freq: Two times a day (BID) | INTRAVENOUS | Status: DC
  Administered 2023-09-11 – 2023-09-12 (×2): 20 mL
  Administered 2023-09-12 – 2023-09-14 (×4): 10 mL

## 2023-09-11 MED ORDER — ONDANSETRON HCL 4 MG/2ML IJ SOLN
4.0000 mg | Freq: Four times a day (QID) | INTRAMUSCULAR | Status: DC | PRN
  Administered 2023-09-12 – 2023-09-13 (×3): 4 mg via INTRAVENOUS
  Filled 2023-09-11 (×3): qty 2

## 2023-09-11 MED ORDER — ACETAMINOPHEN 325 MG PO TABS
650.0000 mg | ORAL_TABLET | Freq: Four times a day (QID) | ORAL | Status: DC | PRN
  Administered 2023-09-13: 650 mg via ORAL
  Filled 2023-09-11: qty 2

## 2023-09-11 MED ORDER — LACTATED RINGERS IV BOLUS
1000.0000 mL | Freq: Once | INTRAVENOUS | Status: AC
  Administered 2023-09-11: 1000 mL via INTRAVENOUS

## 2023-09-11 MED ORDER — MORPHINE SULFATE (PF) 4 MG/ML IV SOLN
4.0000 mg | Freq: Once | INTRAVENOUS | Status: AC
  Administered 2023-09-11: 4 mg via INTRAVENOUS
  Filled 2023-09-11: qty 1

## 2023-09-11 MED ORDER — IOHEXOL 350 MG/ML SOLN
75.0000 mL | Freq: Once | INTRAVENOUS | Status: AC | PRN
  Administered 2023-09-11: 75 mL via INTRAVENOUS

## 2023-09-11 MED ORDER — HYDROMORPHONE HCL 1 MG/ML IJ SOLN
0.5000 mg | INTRAMUSCULAR | Status: DC | PRN
  Administered 2023-09-11 – 2023-09-13 (×6): 0.5 mg via INTRAVENOUS
  Filled 2023-09-11 (×6): qty 0.5

## 2023-09-11 MED ORDER — SENNOSIDES-DOCUSATE SODIUM 8.6-50 MG PO TABS: 1.0000 | ORAL_TABLET | Freq: Every evening | ORAL | Status: DC | PRN

## 2023-09-11 MED ORDER — SODIUM CHLORIDE 0.9 % IV SOLN: INTRAVENOUS | Status: AC

## 2023-09-11 MED ORDER — ACETAMINOPHEN 650 MG RE SUPP: 650.0000 mg | Freq: Four times a day (QID) | RECTAL | Status: DC | PRN

## 2023-09-11 MED ORDER — SODIUM CHLORIDE 0.9% FLUSH
10.0000 mL | INTRAVENOUS | Status: DC | PRN
  Administered 2023-09-13: 10 mL

## 2023-09-11 NOTE — H&P (Signed)
 PCP:   Patient, No Pcp Per   Chief Complaint:  Nausea and vomiting  HPI: This is a 53 year old female with no significant past medical history.  She presents with complaint of abdominal pain, nausea and vomiting starting Saturday.  She adds is probably because she drank too much this weekend.  She is a weekend binge drinker, drank her usual sixpack Saturday and Sunday.  She denies hematemesis, fevers or chills.  She has a history of gallstone pancreatitis but has never had alcoholic pancreatitis.  She reports noticing her abdomen becoming bigger and not being able to decrease it despite losing weight, exercise and doing crunches.  She came to ER because of abdominal pain, nausea vomiting.  In the ER vitals significant for HR 109. CT abdomen pelvis shows acute on chronic pancreatitis involving the pancreatic head and uncinate process. Possible small volume hyperdense/hemorrhagic fluid between the pancreatic head and second portion of duodenum. Thickened appearance of second and third portion of duodenum, likely reactive to the adjacent pancreatitis. There is considerable inflammatory change and small volume fluid in the region Lipase 145, WBCs 23.9, AST 53, ALT 51,  lactic acid 1.9 Blood cultures x 2 collected  Review of Systems:  Per HPI  Past Medical History: Past Medical History:  Diagnosis Date   Eczema    Past Surgical History:  Procedure Laterality Date   CHOLECYSTECTOMY N/A 12/21/2022   Procedure: LAPAROSCOPIC CHOLECYSTECTOMY WITH ICG DYE;  Surgeon: Andria Meuse, MD;  Location: [facility] OR;  Service: General;  Laterality: N/A;   CRYOTHERAPY     DILATION AND CURETTAGE OF UTERUS     VAGINAL HYSTERECTOMY N/A 03/31/2015   Procedure: HYSTERECTOMY VAGINAL;  Surgeon: Tereso Newcomer, MD;  Location: [facility] ORS;  Service: Gynecology;  Laterality: N/A;    Medications: Prior to Admission medications   Medication Sig Start Date End Date Taking? Authorizing Provider  acetaminophen  (TYLENOL) 500 MG tablet Take 2 tablets (1,000 mg total) by mouth every 6 (six) hours as needed. 12/23/22   Barnetta Chapel, PA-C  albuterol (VENTOLIN HFA) 108 (90 Base) MCG/ACT inhaler Inhale 2 puffs into the lungs every 6 (six) hours as needed for wheezing or shortness of breath.    [provider]  fluticasone (CUTIVATE) 0.005 % ointment Apply 1 application  topically daily as needed (eczema).    [provider]  ibuprofen (ADVIL,MOTRIN) 600 MG tablet Take 1 tablet (600 mg total) by mouth every 6 (six) hours as needed. Patient not taking: Reported on 12/15/2022 03/31/15   Anyanwu, Jethro Bastos, MD  oxyCODONE (OXY IR/ROXICODONE) 5 MG immediate release tablet Take 1 tablet (5 mg total) by mouth every 4 (four) hours as needed for moderate pain. 12/23/22   Barnetta Chapel, PA-C    Allergies:  No Known Allergies  Social History:  reports that she has quit smoking. Her smoking use included cigarettes. She has never used smokeless tobacco. She reports current alcohol use. She reports that she does not use drugs.  Family History: HTN  Physical Exam: Vitals:   09/11/23 0928 09/11/23 1038 09/11/23 1455 09/11/23 1852  BP: (!) 138/96  (!) 131/99 (!) 155/99  Pulse: (!) 109  (!) 117 (!) 102  Resp: 16  16 16   Temp: 98.6 F (37 C)  98.7 F (37.1 C) 98.9 F (37.2 C)  TempSrc:   Oral Oral  SpO2: 93%  99% 92%  Weight:  76.7 kg    Height:  5\' 4"  (1.626 m)      General:  A&Ox3, well developed and nourished, no acute distress Eyes: Pink conjunctiva, no scleral icterus ENT: Moist oral mucosa, neck supple, no thyromegaly Lungs: CTA B/L, no wheeze, no crackles, no use of accessory muscles Cardiovascular: Tachycardia, RRR, no murmurs. No carotid bruits, no JVD Abdomen: soft, positive BS, distended, nonspecific generalized TTP, not an acute abdomen GU: not examined Neuro: CN II - XII grossly intact, sensation intact Musculoskeletal: strength 5/5 all extremities, no edema Skin: no rash, no  subcutaneous crepitation, no decubitus Psych: appropriate patient   Labs on Admission:  Recent Labs    09/11/23 1044  NA 138  K 3.0*  CL 97*  CO2 22  GLUCOSE 163*  BUN 11  CREATININE 0.76  CALCIUM 9.5   Recent Labs    09/11/23 1044  AST 53*  ALT 51*  ALKPHOS 55  BILITOT 2.4*  PROT 7.0  ALBUMIN 3.6   Recent Labs    09/11/23 1044  LIPASE 145*   Recent Labs    09/11/23 1044  WBC 23.9*  HGB 15.6*  HCT 45.2  MCV 104.1*  PLT 148*     Radiological Exams on Admission: CT ABDOMEN PELVIS W CONTRAST Result Date: 09/11/2023 CLINICAL DATA:  Nonlocalized abdomen pain EXAM: CT ABDOMEN AND PELVIS WITH CONTRAST TECHNIQUE: Multidetector CT imaging of the abdomen and pelvis was performed using the standard protocol following bolus administration of intravenous contrast. RADIATION DOSE REDUCTION: This exam was performed according to the departmental dose-optimization program which includes automated exposure control, adjustment of the mA and/or kV according to patient size and/or use of iterative reconstruction technique. CONTRAST:  75mL OMNIPAQUE IOHEXOL 350 MG/ML SOLN COMPARISON:  MRI 12/16/2022 FINDINGS: Lower chest: Trace right-sided pleural effusion. Dependent atelectasis at the bases. Mild circumferential distal esophageal thickening Hepatobiliary: Hepatic steatosis. Cholecystectomy. No biliary dilatation Pancreas: Heterogenous enlargement of the pancreatic head and uncinate process with considerable inflammatory change and small volume fluid. Multiple calcifications in the region. Possible small volume hyperdense fluid between the pancreas and second portion of duodenum, series 3, image 39. Spleen: Normal in size without focal abnormality. Adrenals/Urinary Tract: Adrenal glands are unremarkable. Kidneys are normal, without renal calculi, focal lesion, or hydronephrosis. Bladder is unremarkable. Stomach/Bowel: Stomach is nonenlarged. Thickened appearance of second and third portion of  duodenum. Fluid in the colon. Negative appendix Vascular/Lymphatic: No significant vascular findings are present. No enlarged abdominal or pelvic lymph nodes. Reproductive: Status post hysterectomy. No adnexal masses. Other: No free air. Small free fluid in the pelvis and upper abdomen Musculoskeletal: No acute osseous abnormality IMPRESSION: 1. Findings consistent with acute on chronic pancreatitis involving the pancreatic head and uncinate process. There is considerable inflammatory change and small volume fluid in the region. No organized fluid collections on this exam. Possible small volume hyperdense/hemorrhagic fluid between the pancreatic head and second portion of duodenum. 2. Thickened appearance of second and third portion of duodenum, likely reactive to the adjacent pancreatitis. 3. Hepatic steatosis. 4. Trace right-sided pleural effusion with dependent atelectasis at the bases. 5. Mild circumferential distal esophageal thickening, question esophagitis. Electronically Signed   By: Jasmine Pang M.D.   On: 09/11/2023 21:01    Assessment/Plan Present on Admission: Acute pancreatitis secondary to alcohol abuse -N.p.o., aggressive IV fluid hydration -IV Dilaudid as needed -Zofran as needed -CIWA protocol initiated -Thiamine, folate, multivitamin ordered -Magnesium and phosphorus levels ordered   Dell Hurtubise 09/11/2023, 9:40 PM

## 2023-09-11 NOTE — ED Triage Notes (Signed)
 Pt. Stated, I think my pancreatitis is back . Ive got stomach bloating, N/V  since Saturday.

## 2023-09-11 NOTE — Plan of Care (Signed)

## 2023-09-11 NOTE — ED Provider Notes (Signed)
 [place] EMERGENCY DEPARTMENT AT [facility] Provider Note   CSN: [id number] Arrival date & time: 09/11/23  0909     History {Add pertinent medical, surgical, social history, OB history to HPI:1} Chief Complaint  Patient presents with  . Emesis  . Nausea  . Abdominal Pain    Kimberly [name] is a 53 y.o. female.  Patient with history of pancreatitis presents today with complaints of abdominal pain, nausea, and vomiting. Pain is epigastric in nature. Symptoms began on Saturday and have been persistent since then. She has not been able to eat or drink anything since then. States pain feels like her previous flare of pancreatitis. Notes that this was attributed to gallstones and she had her gallbladder removed and has not had any issues since then. Does note that she drinks about a 6 pack of beer a day on the weekend which is new. Denies fevers or chills. No diarrhea, has not had a bowel movement since symptom onset.   The history is provided by the patient. No language interpreter was used.  Emesis Associated symptoms: abdominal pain   Abdominal Pain Associated symptoms: nausea and vomiting        Home Medications Prior to Admission medications   Medication Sig Start Date End Date Taking? Authorizing Provider  acetaminophen (TYLENOL) 500 MG tablet Take 2 tablets (1,000 mg total) by mouth every 6 (six) hours as needed. 12/23/22   Barnetta Chapel, PA-C  albuterol (VENTOLIN HFA) 108 (90 Base) MCG/ACT inhaler Inhale 2 puffs into the lungs every 6 (six) hours as needed for wheezing or shortness of breath.    [provider]  fluticasone (CUTIVATE) 0.005 % ointment Apply 1 application  topically daily as needed (eczema).    [provider]  ibuprofen (ADVIL,MOTRIN) 600 MG tablet Take 1 tablet (600 mg total) by mouth every 6 (six) hours as needed. Patient not taking: Reported on 12/15/2022 03/31/15   Anyanwu, Jethro Bastos, MD  oxyCODONE (OXY IR/ROXICODONE) 5 MG  immediate release tablet Take 1 tablet (5 mg total) by mouth every 4 (four) hours as needed for moderate pain. 12/23/22   Barnetta Chapel, PA-C      Allergies    Patient has no known allergies.    Review of Systems   Review of Systems  Gastrointestinal:  Positive for abdominal pain, nausea and vomiting.  All other systems reviewed and are negative.   Physical Exam Updated Vital Signs BP (!) 155/99 (BP Location: Left Arm)   Pulse (!) 102   Temp 98.9 F (37.2 C) (Oral)   Resp 16   Ht 5\' 4"  (1.626 m)   Wt 76.7 kg   LMP 01/18/2015 Comment: on progesterone   SpO2 92%   BMI 29.01 kg/m  Physical Exam Vitals and nursing note reviewed.  Constitutional:      General: She is not in acute distress.    Appearance: Normal appearance. She is normal weight. She is not ill-appearing, toxic-appearing or diaphoretic.  HENT:     Head: Normocephalic and atraumatic.  Cardiovascular:     Rate and Rhythm: Normal rate.  Pulmonary:     Effort: Pulmonary effort is normal. No respiratory distress.  Abdominal:     General: Abdomen is flat.     Palpations: Abdomen is soft.     Tenderness: There is generalized abdominal tenderness and tenderness in the epigastric area.  Musculoskeletal:        General: Normal range of motion.     Cervical back: Normal range  of motion.  Skin:    General: Skin is warm and dry.  Neurological:     General: No focal deficit present.     Mental Status: She is alert.  Psychiatric:        Mood and Affect: Mood normal.        Behavior: Behavior normal.    ED Results / Procedures / Treatments   Labs (all labs ordered are listed, but only abnormal results are displayed) Labs Reviewed  LIPASE, BLOOD - Abnormal; Notable for the following components:      Result Value   Lipase 145 (*)    All other components within normal limits  COMPREHENSIVE METABOLIC PANEL - Abnormal; Notable for the following components:   Potassium 3.0 (*)    Chloride 97 (*)    Glucose, Bld 163  (*)    AST 53 (*)    ALT 51 (*)    Total Bilirubin 2.4 (*)    Anion gap 19 (*)    All other components within normal limits  CBC - Abnormal; Notable for the following components:   WBC 23.9 (*)    Hemoglobin 15.6 (*)    MCV 104.1 (*)    MCH 35.9 (*)    Platelets 148 (*)    All other components within normal limits  URINALYSIS, ROUTINE W REFLEX MICROSCOPIC - Abnormal; Notable for the following components:   Color, Urine AMBER (*)    APPearance HAZY (*)    Glucose, UA 50 (*)    Bilirubin Urine SMALL (*)    Ketones, ur 5 (*)    Protein, ur 100 (*)    Bacteria, UA RARE (*)    All other components within normal limits  CULTURE, BLOOD (ROUTINE X 2)  CULTURE, BLOOD (ROUTINE X 2)  HCG, SERUM, QUALITATIVE  I-STAT CG4 LACTIC ACID, ED    EKG None  Radiology CT ABDOMEN PELVIS W CONTRAST Result Date: 09/11/2023 CLINICAL DATA:  Nonlocalized abdomen pain EXAM: CT ABDOMEN AND PELVIS WITH CONTRAST TECHNIQUE: Multidetector CT imaging of the abdomen and pelvis was performed using the standard protocol following bolus administration of intravenous contrast. RADIATION DOSE REDUCTION: This exam was performed according to the departmental dose-optimization program which includes automated exposure control, adjustment of the mA and/or kV according to patient size and/or use of iterative reconstruction technique. CONTRAST:  75mL OMNIPAQUE IOHEXOL 350 MG/ML SOLN COMPARISON:  MRI 12/16/2022 FINDINGS: Lower chest: Trace right-sided pleural effusion. Dependent atelectasis at the bases. Mild circumferential distal esophageal thickening Hepatobiliary: Hepatic steatosis. Cholecystectomy. No biliary dilatation Pancreas: Heterogenous enlargement of the pancreatic head and uncinate process with considerable inflammatory change and small volume fluid. Multiple calcifications in the region. Possible small volume hyperdense fluid between the pancreas and second portion of duodenum, series 3, image 39. Spleen: Normal in  size without focal abnormality. Adrenals/Urinary Tract: Adrenal glands are unremarkable. Kidneys are normal, without renal calculi, focal lesion, or hydronephrosis. Bladder is unremarkable. Stomach/Bowel: Stomach is nonenlarged. Thickened appearance of second and third portion of duodenum. Fluid in the colon. Negative appendix Vascular/Lymphatic: No significant vascular findings are present. No enlarged abdominal or pelvic lymph nodes. Reproductive: Status post hysterectomy. No adnexal masses. Other: No free air. Small free fluid in the pelvis and upper abdomen Musculoskeletal: No acute osseous abnormality IMPRESSION: 1. Findings consistent with acute on chronic pancreatitis involving the pancreatic head and uncinate process. There is considerable inflammatory change and small volume fluid in the region. No organized fluid collections on this exam. Possible small volume hyperdense/hemorrhagic fluid  between the pancreatic head and second portion of duodenum. 2. Thickened appearance of second and third portion of duodenum, likely reactive to the adjacent pancreatitis. 3. Hepatic steatosis. 4. Trace right-sided pleural effusion with dependent atelectasis at the bases. 5. Mild circumferential distal esophageal thickening, question esophagitis. Electronically Signed   By: Jasmine Pang M.D.   On: 09/11/2023 21:01    Procedures Procedures  {Document cardiac monitor, telemetry assessment procedure when appropriate:1}  Medications Ordered in ED Medications  sodium chloride flush (NS) 0.9 % injection 10-40 mL (has no administration in time range)  sodium chloride flush (NS) 0.9 % injection 10-40 mL (has no administration in time range)  lactated ringers bolus 1,000 mL (1,000 mLs Intravenous New Bag/Given 09/11/23 1900)  morphine (PF) 4 MG/ML injection 4 mg (4 mg Intravenous Given 09/11/23 1901)  ondansetron (ZOFRAN) injection 4 mg (4 mg Intravenous Given 09/11/23 1900)  iohexol (OMNIPAQUE) 350 MG/ML injection 75  mL (75 mLs Intravenous Contrast Given 09/11/23 1819)    ED Course/ Medical Decision Making/ A&P   {   Click here for ABCD2, HEART and other calculatorsREFRESH Note before signing :1}                              Medical Decision Making Amount and/or Complexity of Data Reviewed Labs: ordered. Radiology: ordered.  Risk Prescription drug management. Decision regarding hospitalization.   This patient is a 53 y.o. female who presents to the ED for concern of abdominal pain, nausea, and vomiting, this involves an extensive number of treatment options, and is a complaint that carries with it a high risk of complications and morbidity. The emergent differential diagnosis prior to evaluation includes, but is not limited to,  PUD, gastritis, pancreatitis, gastroparesis, malignancy, biliary disease, ACS, pericarditis, pneumonia, intestinal ischemia, esophageal rupture, hepatitis, pregnancy   This is not an exhaustive differential.   Past Medical History / Co-morbidities / Social History: Hx pancreatitis  Additional history: Chart reviewed. Pertinent results include: hx gallstone pancreatitis in 6/24 status post cholecystectomy  Physical Exam: Physical exam performed. The pertinent findings include: unwell appearing, vomiting during exam. Generalized abdominal tenderness  Lab Tests: I ordered, and personally interpreted labs.  The pertinent results include:  ***   Imaging Studies: I ordered imaging studies including ***. I independently visualized and interpreted imaging which showed ***. I agree with the radiologist interpretation.   Cardiac Monitoring:  The patient was maintained on a cardiac monitor.  My attending physician Dr. Marland Kitchen viewed and interpreted the cardiac monitored which showed an underlying rhythm of: ***. I agree with this interpretation.   Medications: I ordered medication including ***  for ***. Reevaluation of the patient after these medicines showed that the patient  {resolved/improved/worsened:23923::"improved"}. I have reviewed the patients home medicines and have made adjustments as needed.  Consultations Obtained: I requested consultation with the ***,  and discussed lab and imaging findings as well as pertinent plan - they recommend: ***   Disposition: After consideration of the diagnostic results and the patients response to treatment, I feel that *** .   ***emergency department workup does not suggest an emergent condition requiring admission or immediate intervention beyond what has been performed at this time. The plan is: ***. The patient is safe for discharge and has been instructed to return immediately for worsening symptoms, change in symptoms or any other concerns.  I discussed this case with my attending physician Dr. Marland Kitchen who cosigned this note including  patient's presenting symptoms, physical exam, and planned diagnostics and interventions. Attending physician stated agreement with plan or made changes to plan which were implemented.     {Document critical care time when appropriate:1} {Document review of labs and clinical decision tools ie heart score, Chads2Vasc2 etc:1}  {Document your independent review of radiology images, and any outside records:1} {Document your discussion with family members, caretakers, and with consultants:1} {Document social determinants of health affecting pt's care:1} {Document your decision making why or why not admission, treatments were needed:1} Final Clinical Impression(s) / ED Diagnoses Final diagnoses:  None    Rx / DC Orders ED Discharge Orders     None

## 2023-09-11 NOTE — Progress Notes (Signed)

## 2023-09-12 DIAGNOSIS — K852 Alcohol induced acute pancreatitis without necrosis or infection: Secondary | ICD-10-CM | POA: Diagnosis not present

## 2023-09-12 LAB — CBC WITH DIFFERENTIAL/PLATELET
Abs Immature Granulocytes: 0.11 10*3/uL — ABNORMAL HIGH (ref 0.00–0.07)
Basophils Absolute: 0 10*3/uL (ref 0.0–0.1)
Basophils Relative: 0 %
Eosinophils Absolute: 0 10*3/uL (ref 0.0–0.5)
Eosinophils Relative: 0 %
HCT: 41.9 % (ref 36.0–46.0)
Hemoglobin: 13.8 g/dL (ref 12.0–15.0)
Immature Granulocytes: 1 %
Lymphocytes Relative: 8 %
Lymphs Abs: 1.5 10*3/uL (ref 0.7–4.0)
MCH: 34.9 pg — ABNORMAL HIGH (ref 26.0–34.0)
MCHC: 32.9 g/dL (ref 30.0–36.0)
MCV: 106.1 fL — ABNORMAL HIGH (ref 80.0–100.0)
Monocytes Absolute: 0.8 10*3/uL (ref 0.1–1.0)
Monocytes Relative: 5 %
Neutro Abs: 15.5 10*3/uL — ABNORMAL HIGH (ref 1.7–7.7)
Neutrophils Relative %: 86 %
Platelets: 122 10*3/uL — ABNORMAL LOW (ref 150–400)
RBC: 3.95 MIL/uL (ref 3.87–5.11)
RDW: 13.3 % (ref 11.5–15.5)
WBC: 18 10*3/uL — ABNORMAL HIGH (ref 4.0–10.5)
nRBC: 0 % (ref 0.0–0.2)

## 2023-09-12 LAB — COMPREHENSIVE METABOLIC PANEL
ALT: 70 U/L — ABNORMAL HIGH (ref 0–44)
AST: 90 U/L — ABNORMAL HIGH (ref 15–41)
Albumin: 3 g/dL — ABNORMAL LOW (ref 3.5–5.0)
Alkaline Phosphatase: 73 U/L (ref 38–126)
Anion gap: 11 (ref 5–15)
BUN: 11 mg/dL (ref 6–20)
CO2: 24 mmol/L (ref 22–32)
Calcium: 8.6 mg/dL — ABNORMAL LOW (ref 8.9–10.3)
Chloride: 104 mmol/L (ref 98–111)
Creatinine, Ser: 0.64 mg/dL (ref 0.44–1.00)
GFR, Estimated: >60 mL/min (ref >60)
Glucose, Bld: 130 mg/dL — ABNORMAL HIGH (ref 70–99)
Potassium: 2.9 mmol/L — ABNORMAL LOW (ref 3.5–5.1)
Sodium: 139 mmol/L (ref 135–145)
Total Bilirubin: 1.5 mg/dL — ABNORMAL HIGH (ref 0.0–1.2)
Total Protein: 6.1 g/dL — ABNORMAL LOW (ref 6.5–8.1)

## 2023-09-12 LAB — MAGNESIUM: Magnesium: 2 mg/dL (ref 1.7–2.4)

## 2023-09-12 LAB — LIPASE, BLOOD: Lipase: 129 U/L — ABNORMAL HIGH (ref 11–51)

## 2023-09-12 MED ORDER — LORAZEPAM 2 MG/ML IJ SOLN: 1.0000 mg | INTRAMUSCULAR | Status: DC | PRN

## 2023-09-12 MED ORDER — THIAMINE MONONITRATE 100 MG PO TABS
100.0000 mg | ORAL_TABLET | Freq: Every day | ORAL | Status: DC
  Administered 2023-09-12 – 2023-09-14 (×3): 100 mg via ORAL
  Filled 2023-09-12 (×3): qty 1

## 2023-09-12 MED ORDER — LORAZEPAM 1 MG PO TABS: 1.0000 mg | ORAL_TABLET | ORAL | Status: DC | PRN

## 2023-09-12 MED ORDER — POTASSIUM CHLORIDE CRYS ER 20 MEQ PO TBCR
40.0000 meq | EXTENDED_RELEASE_TABLET | Freq: Two times a day (BID) | ORAL | Status: DC
  Administered 2023-09-12 – 2023-09-14 (×5): 40 meq via ORAL
  Filled 2023-09-12 (×5): qty 2

## 2023-09-12 MED ORDER — THIAMINE HCL 100 MG/ML IJ SOLN: 100.0000 mg | Freq: Every day | INTRAMUSCULAR | Status: DC

## 2023-09-12 MED ORDER — FOLIC ACID 1 MG PO TABS
1.0000 mg | ORAL_TABLET | Freq: Every day | ORAL | Status: DC
  Administered 2023-09-12 – 2023-09-14 (×3): 1 mg via ORAL
  Filled 2023-09-12 (×3): qty 1

## 2023-09-12 MED ORDER — PANTOPRAZOLE SODIUM 40 MG IV SOLR
40.0000 mg | Freq: Every day | INTRAVENOUS | Status: DC
  Administered 2023-09-12 – 2023-09-13 (×2): 40 mg via INTRAVENOUS
  Filled 2023-09-12 (×2): qty 10

## 2023-09-12 MED ORDER — ADULT MULTIVITAMIN W/MINERALS CH
1.0000 | ORAL_TABLET | Freq: Every day | ORAL | Status: DC
  Administered 2023-09-12 – 2023-09-14 (×3): 1 via ORAL
  Filled 2023-09-12 (×3): qty 1

## 2023-09-12 NOTE — Plan of Care (Signed)

## 2023-09-12 NOTE — TOC Initial Note (Addendum)
 Transition of Care [facility]) - Initial/Assessment Note    Patient Details  Name: Kimberly [name] MRN: [id number] Date of Birth: [birth year]  [facility] CM/SW Contact:    Marliss Coots, LCSW Phone Number: 09/12/2023, 4:17 PM  Clinical Narrative:                  4:17 PM CSW introduced herself and role to patient at bedside. Patient had two guests also present at bedside. Patient requested CSW to return upon guest departures.    Barriers to Discharge: Continued Medical Work up   Patient Goals and CMS Choice            Expected Discharge Plan and Services In-house Referral: Clinical Social Work     Living arrangements for the past 2 months: Single Family Home                                      Prior Living Arrangements/Services Living arrangements for the past 2 months: Single Family Home Lives with:: Spouse Patient language and need for interpreter reviewed:: Yes        Need for Family Participation in Patient Care: No (Comment) Care giver support system in place?: No (comment)   Criminal Activity/Legal Involvement Pertinent to Current Situation/Hospitalization: No - Comment as needed  Activities of Daily Living   ADL Screening (condition at time of admission) Independently performs ADLs?: Yes (appropriate for developmental age) Is the patient deaf or have difficulty hearing?: Yes Does the patient have difficulty seeing, even when wearing glasses/contacts?: Yes Does the patient have difficulty concentrating, remembering, or making decisions?: No  Permission Sought/Granted Permission sought to share information with : Family Supports Permission granted to share information with : No (Contact information on chart)  Share Information with NAME: Tim Degroodt     Permission granted to share info w Relationship: Spouse  Permission granted to share info w Contact Information: [phone]  Emotional Assessment Appearance:: Appears stated  age Attitude/Demeanor/Rapport: Engaged Affect (typically observed): Accepting, Appropriate, Adaptable, Stable, Calm Orientation: : Oriented to Self, Oriented to Place, Oriented to  Time, Oriented to Situation Alcohol / Substance Use: Alcohol Use Psych Involvement: No (comment)  Admission diagnosis:  Acute pancreatitis [K85.90] Acute pancreatitis without infection or necrosis, unspecified pancreatitis type [K85.90] Patient Active Problem List   Diagnosis Date Noted   Acute pancreatitis 09/11/2023   Acute biliary pancreatitis 12/15/2022   Abnormal uterine bleeding (AUB) 03/31/2015   Fibroids 03/31/2015   S/P vaginal hysterectomy 03/31/2015   PCP:  Patient, No Pcp Per Pharmacy:   [id number] - [place], [place] - [address] [place] [address] Phone: [phone] Fax: [phone]  [facility] [address], [place] - [address] [place] [address] Phone: [phone] Fax: [phone]     Social Drivers of Health (SDOH) Social History: SDOH Screenings   Food Insecurity: No Food Insecurity (09/11/2023)  Housing: Low Risk  (09/11/2023)  Transportation Needs: No Transportation Needs (09/11/2023)  Utilities: Not At Risk (09/11/2023)  Tobacco Use: Medium Risk (09/11/2023)   SDOH Interventions:     Readmission Risk Interventions     No data to display

## 2023-09-12 NOTE — Progress Notes (Signed)
 PROGRESS NOTE  Kimberly [name]  DOB: 23-Aug-1970  PCP: Patient, No Pcp Per [facility]:[id number]  DOA: 09/11/2023  LOS: 1 day  Hospital Day: 2  Brief narrative: Kimberly [name] is a 53 y.o. female with PMH significant for past episode of pancreatitis. 3/10, patient presented to the ED with complaint of abdominal pain, nausea, vomiting for 2 days.   Patient reports she is a weekend binge drinker and drank her usual sixpack last weekend after which her symptoms started.  Denies drinking during the week.  In the ED, patient was afebrile, mildly tachycardic Lipase level was elevated to 145, AST, ALT mildly elevated, alk phos normal, total bili elevated 2.4 CT abdomen pelvis showed findings consistent with acute on chronic pancreatitis involving the pancreatic head and uncinate process with considerable inflammatory change and small volume fluid in the region.  No organized fluid collection.  Possible small volume hyperdense/hemorrhagic fluid within the pancreatic head and second portion of duodenum.  Also showed hepatic steatosis and mild esophagitis. Admitted to [facility] for acute pancreatitis   Subjective: Patient was seen and examined this morning.  Middle-aged Caucasian female. Walking around the room.  Pain partially controlled on pain meds. Chart reviewed Mildly tachycardic, blood pressure 140s Labs this morning with potassium low at 2.9, lipase level somewhat better at 1.9 this morning.  Assessment and plan: Acute on chronic alcoholic pancreatitis Presented with abdominal pain, nausea, vomiting after binge drinking last weekend. Lipase level elevated.  CT abdomen with acute on chronic pancreatic inflammation.  Also mentions small hemorrhagic fluid within the pancreatic head Currently on conservative management with n.p.o. status, IV hydration, IV Dilaudid, IV Zofran Lipase level slightly better. Continue n.p.o. status today, IV hydration. Recent Labs  Lab 09/11/23 1044 09/12/23 0314  LIPASE 145*  129*    Chronic alcohol use Reports only drinking in the weekend.  Unclear reliability. CIWA protocol Counseled to quit alcohol  Hepatic steatosis  Elevated LFTs and bilirubin Reports gallstone pancreatitis in July 2024.  CT abdomen did not show any CBD dilatation.  Alk phos is normal.  Bilirubin was elevated and downtrending.  AST ALT elevated likely due to hepatic steatosis. Recent Labs  Lab 09/11/23 1044 09/12/23 0314  AST 53* 90*  ALT 51* 70*  ALKPHOS 55 73  BILITOT 2.4* 1.5*  PROT 7.0 6.1*  ALBUMIN 3.6 3.0*  LIPASE 145* 129*  PLT 148* 122*   Hypokalemia Potassium level was significantly low at 2.9 this morning.  Replacement given. Continue monitor.  Check phosphorus level Recent Labs  Lab 09/11/23 1044 09/12/23 0314  K 3.0* 2.9*  MG  --  2.0   Esophagitis Mild esophageal wall thickening noted CT scan.  Probably due to alcohol use.  Seems to be on Advil as needed as well PPI   Mobility: Encourage ambulation  Goals of care   Code Status: Full Code     DVT prophylaxis:  SCDs Start: 09/11/23 2159   Antimicrobials: None  Fluid: NS at 150 mL/h Consultants: None Family Communication: None at bedside  Status: Inpatient Level of care:  Med-Surg   Patient is from: Home Needs to continue in-hospital care: Ongoing conservative management of pancreatitis Anticipated d/c to: Pending clinical course, hopefully home in 1 to 2 days   Diet:  Diet Order             Diet NPO time specified Except for: Sips with Meds  Diet effective now  Scheduled Meds:  pantoprazole (PROTONIX) IV  40 mg Intravenous QAC breakfast   potassium chloride  40 mEq Oral BID   sodium chloride flush  10-40 mL Intracatheter Q12H    PRN meds: acetaminophen **OR** acetaminophen, HYDROmorphone (DILAUDID) injection, ondansetron (ZOFRAN) IV, senna-docusate, sodium chloride flush   Infusions:   sodium chloride 150 mL/hr at 09/12/23 1119     Antimicrobials: Anti-infectives (From admission, onward)    None       Objective: Vitals:   09/11/23 2256 09/12/23 0739  BP: (!) 146/92 (!) 140/98  Pulse: 95 (!) 104  Resp: 18 18  Temp: 98.6 F (37 C) 99.3 F (37.4 C)  SpO2: 91% 91%    Intake/Output Summary (Last 24 hours) at 09/12/2023 1355 Last data filed at 09/12/2023 0600 Gross per 24 hour  Intake 1005.72 ml  Output --  Net 1005.72 ml   Filed Weights   09/11/23 1038  Weight: 76.7 kg   Weight change:  Body mass index is 29.01 kg/m.   Physical Exam: General exam: Pleasant, middle-aged Caucasian female.  Pain partially controlled Skin: No rashes, lesions or ulcers. HEENT: Atraumatic, normocephalic, no obvious bleeding Lungs: Clear to auscultation bilaterally,  CVS: S1, S2, no murmur,   GI/Abd: Soft, mild epigastric tenderness, nondistended, bowel sound present,   CNS: Alert, awake, oriented x 3 Psychiatry: Mood appropriate,  Extremities: No pedal edema, no calf tenderness,   Data Review: I have personally reviewed the laboratory data and studies available.  F/u labs ordered Unresulted Labs (From admission, onward)     Start     Ordered   09/13/23 0500  Comprehensive metabolic panel  Tomorrow morning,   R       Question:  Specimen collection method  Answer:  IV Team=IV Team collect   09/12/23 0837   09/13/23 0500  CBC with Differential/Platelet  Tomorrow morning,   R       Question:  Specimen collection method  Answer:  IV Team=IV Team collect   09/12/23 0837   09/13/23 0500  Lipase, blood  Tomorrow morning,   R       Question:  Specimen collection method  Answer:  IV Team=IV Team collect   09/12/23 0837   09/13/23 0500  Phosphorus  Tomorrow morning,   R       Question:  Specimen collection method  Answer:  IV Team=IV Team collect   09/12/23 0840            Total time spent in review of labs and imaging, patient evaluation, formulation of plan, documentation and communication with family: 55  minutes  Signed, Lorin Glass, MD [facility] 09/12/2023

## 2023-09-13 DIAGNOSIS — K852 Alcohol induced acute pancreatitis without necrosis or infection: Secondary | ICD-10-CM | POA: Diagnosis not present

## 2023-09-13 LAB — COMPREHENSIVE METABOLIC PANEL
ALT: 44 U/L (ref 0–44)
AST: 32 U/L (ref 15–41)
Albumin: 2.5 g/dL — ABNORMAL LOW (ref 3.5–5.0)
Alkaline Phosphatase: 54 U/L (ref 38–126)
Anion gap: 8 (ref 5–15)
BUN: 8 mg/dL (ref 6–20)
CO2: 23 mmol/L (ref 22–32)
Calcium: 8.3 mg/dL — ABNORMAL LOW (ref 8.9–10.3)
Chloride: 105 mmol/L (ref 98–111)
Creatinine, Ser: 0.66 mg/dL (ref 0.44–1.00)
GFR, Estimated: >60 mL/min (ref >60)
Glucose, Bld: 104 mg/dL — ABNORMAL HIGH (ref 70–99)
Potassium: 3.2 mmol/L — ABNORMAL LOW (ref 3.5–5.1)
Sodium: 136 mmol/L (ref 135–145)
Total Bilirubin: 1.3 mg/dL — ABNORMAL HIGH (ref 0.0–1.2)
Total Protein: 5.2 g/dL — ABNORMAL LOW (ref 6.5–8.1)

## 2023-09-13 LAB — CBC WITH DIFFERENTIAL/PLATELET
Abs Immature Granulocytes: 0.13 10*3/uL — ABNORMAL HIGH (ref 0.00–0.07)
Basophils Absolute: 0 10*3/uL (ref 0.0–0.1)
Basophils Relative: 0 %
Eosinophils Absolute: 0 10*3/uL (ref 0.0–0.5)
Eosinophils Relative: 0 %
HCT: 35.3 % — ABNORMAL LOW (ref 36.0–46.0)
Hemoglobin: 12 g/dL (ref 12.0–15.0)
Immature Granulocytes: 1 %
Lymphocytes Relative: 11 %
Lymphs Abs: 1.4 10*3/uL (ref 0.7–4.0)
MCH: 35.7 pg — ABNORMAL HIGH (ref 26.0–34.0)
MCHC: 34 g/dL (ref 30.0–36.0)
MCV: 105.1 fL — ABNORMAL HIGH (ref 80.0–100.0)
Monocytes Absolute: 0.8 10*3/uL (ref 0.1–1.0)
Monocytes Relative: 6 %
Neutro Abs: 11.1 10*3/uL — ABNORMAL HIGH (ref 1.7–7.7)
Neutrophils Relative %: 82 %
Platelets: 93 10*3/uL — ABNORMAL LOW (ref 150–400)
RBC: 3.36 MIL/uL — ABNORMAL LOW (ref 3.87–5.11)
RDW: 13.2 % (ref 11.5–15.5)
WBC: 13.4 10*3/uL — ABNORMAL HIGH (ref 4.0–10.5)
nRBC: 0.1 % (ref 0.0–0.2)

## 2023-09-13 LAB — PHOSPHORUS: Phosphorus: 2.2 mg/dL — ABNORMAL LOW (ref 2.5–4.6)

## 2023-09-13 LAB — LIPASE, BLOOD: Lipase: 112 U/L — ABNORMAL HIGH (ref 11–51)

## 2023-09-13 MED ORDER — MORPHINE SULFATE (PF) 2 MG/ML IV SOLN
2.0000 mg | INTRAVENOUS | Status: DC | PRN
  Administered 2023-09-13: 2 mg via INTRAVENOUS
  Filled 2023-09-13: qty 1

## 2023-09-13 MED ORDER — POTASSIUM PHOSPHATES 15 MMOLE/5ML IV SOLN
30.0000 mmol | Freq: Once | INTRAVENOUS | Status: AC
  Administered 2023-09-13: 30 mmol via INTRAVENOUS
  Filled 2023-09-13: qty 10

## 2023-09-13 NOTE — Plan of Care (Signed)
  Problem: Education: Goal: Knowledge of General Education information will improve Description: Including pain rating scale, medication(s)/side effects and non-pharmacologic comfort measures 09/13/2023 1848 by Lacretia Nicks D, LPN Outcome: Progressing 09/13/2023 1845 by Lacretia Nicks D, LPN Outcome: Progressing   Problem: Health Behavior/Discharge Planning: Goal: Ability to manage health-related needs will improve 09/13/2023 1848 by Lacretia Nicks D, LPN Outcome: Progressing 09/13/2023 1845 by Lacretia Nicks D, LPN Outcome: Progressing   Problem: Clinical Measurements: Goal: Ability to maintain clinical measurements within normal limits will improve Outcome: Progressing Goal: Will remain free from infection 09/13/2023 1848 by Lacretia Nicks D, LPN Outcome: Progressing 09/13/2023 1845 by Lacretia Nicks D, LPN Outcome: Progressing Goal: Diagnostic test results will improve Outcome: Progressing Goal: Respiratory complications will improve Outcome: Progressing Goal: Cardiovascular complication will be avoided Outcome: Progressing   Problem: Activity: Goal: Risk for activity intolerance will decrease Outcome: Progressing   Problem: Nutrition: Goal: Adequate nutrition will be maintained 09/13/2023 1848 by Lacretia Nicks D, LPN Outcome: Progressing 09/13/2023 1845 by Lacretia Nicks D, LPN Outcome: Progressing   Problem: Coping: Goal: Level of anxiety will decrease Outcome: Progressing   Problem: Elimination: Goal: Will not experience complications related to bowel motility 09/13/2023 1848 by Lacretia Nicks D, LPN Outcome: Progressing 09/13/2023 1845 by Lacretia Nicks D, LPN Outcome: Progressing Goal: Will not experience complications related to urinary retention Outcome: Progressing   Problem: Pain Managment: Goal: General experience of comfort will improve and/or be controlled 09/13/2023 1848 by Lacretia Nicks D, LPN Outcome: Progressing 09/13/2023  1845 by Lacretia Nicks D, LPN Outcome: Progressing   Problem: Safety: Goal: Ability to remain free from injury will improve Outcome: Progressing   Problem: Skin Integrity: Goal: Risk for impaired skin integrity will decrease Outcome: Progressing

## 2023-09-13 NOTE — Progress Notes (Signed)
 Progress Note   Patient: Kimberly [name] ZOX:[id number] DOB: 06/13/1971 DOA: 09/11/2023     2 DOS: the patient was seen and examined on 09/13/2023   Brief hospital course: 53yo with h/o ETOH use d/o with recurrent pancreatitis who presented on 3/10 with abdominal pain and n/v.  CT with acute on chronic pancreatitis, conservative management + CIWA protocol.   Assessment and Plan:  Acute on chronic alcoholic pancreatitis Presented with abdominal pain, nausea, vomiting after binge drinking last weekend Lipase level elevated, CT with acute on chronic pancreatic inflammation (also mentions small hemorrhagic fluid within the pancreatic head) Reports controlled pain, developing hunger and wants to advance diet Tolerated CLD for lunch, will try full liquids for dinner Stop Dilaudid, change to 2 mg morphine as needed for severe pain   Chronic alcohol use Reports only drinking in the weekend - unclear reliability CIWA protocol - low scores Counseled to quit alcohol   Hepatic steatosis  Elevated LFTs and bilirubin, resolved Likely alcoholic hepatitis + fatty liver Needs to stop drinking  Hypokalemia/hypophosphatemia Replacement given Recheck in AM  Esophagitis Mild esophageal wall thickening noted CT scan - likely due to alcohol use PPI     Consultants: TOC team  Procedures: None  Antibiotics: None  30 Day Unplanned Readmission Risk Score    Flowsheet Row ED to Hosp-Admission (Current) from 09/11/2023 in [facility] GENERAL MED/SURG UNIT  30 Day Unplanned Readmission Risk Score (%) 6.99 Filed at 09/13/2023 0801       This score is the patient's risk of an unplanned readmission within 30 days of being discharged (0 -100%). The score is based on dignosis, age, lab data, medications, orders, and past utilization.   Low:  0-14.9   Medium: 15-21.9   High: 22-29.9   Extreme: 30 and above           Subjective: Feeling better.  Hungry.  Wants to advance  diet.   Objective: Vitals:   09/13/23 0335 09/13/23 0748  BP: 133/83 124/74  Pulse: 84 87  Resp: 18   Temp: 98.9 F (37.2 C) 99 F (37.2 C)  SpO2: 92% (!) 89%    Intake/Output Summary (Last 24 hours) at 09/13/2023 1510 Last data filed at 09/13/2023 0135 Gross per 24 hour  Intake 2011.78 ml  Output --  Net 2011.78 ml   Filed Weights   09/11/23 1038  Weight: 76.7 kg    Exam:  General:  Appears calm and comfortable and is in NAD Eyes:  EOMI, normal lids, iris ENT:  grossly normal hearing, lips & tongue, mmm Neck:  no LAD, masses or thyromegaly Cardiovascular:  RRR, no m/r/g. No LE edema.  Respiratory:   CTA bilaterally with no wheezes/rales/rhonchi.  Normal respiratory effort. Abdomen:  soft, mildly diffusely tender, ND Skin:  no rash or induration seen on limited exam Musculoskeletal:  grossly normal tone BUE/BLE, good ROM, no bony abnormality Psychiatric:  blunted mood and affect, speech fluent and appropriate, AOx3 Neurologic:  CN 2-12 grossly intact, moves all extremities in coordinated fashion  Data Reviewed: I have reviewed the patient's lab results since admission.  Pertinent labs for today include:   K+ 3.2 Glucose 104 Phos 2.2 Bili 1.3, down from 2.4 WBC 13.4 Platelets 93     Family Communication: None present  Disposition: Status is: Inpatient Remains inpatient appropriate because: ongoing management     Time spent: 50 minutes  Unresulted Labs (From admission, onward)     Start     Ordered   09/14/23  0500  CBC with Differential/Platelet  Tomorrow morning,   R       Question:  Specimen collection method  Answer:  IV Team=IV Team collect   09/13/23 1508   09/14/23 0500  Basic metabolic panel  Tomorrow morning,   R       Question:  Specimen collection method  Answer:  IV Team=IV Team collect   09/13/23 1508   09/14/23 0500  Magnesium  Tomorrow morning,   R       Question:  Specimen collection method  Answer:  IV Team=IV Team collect    09/13/23 1508   09/14/23 0500  Phosphorus  Tomorrow morning,   R       Question:  Specimen collection method  Answer:  IV Team=IV Team collect   09/13/23 1508             Author: Jonah Blue, MD 09/13/2023 3:10 PM  For on call review [email or web address].

## 2023-09-13 NOTE — TOC Progression Note (Signed)
 Transition of Care [facility]) - Progression Note    Patient Details  Name: Kimberly [name] MRN: [id number] Date of Birth: [birth year]  Transition of [facility]) CM/SW Contact  Marliss Coots, LCSW Phone Number: 09/13/2023, 10:03 AM  Clinical Narrative:     10:03 AM CSW returned to patient's bedside to follow up on [facility] consult (substance use counseling/education) and offered resources. Patient declined CSW offer.    Barriers to Discharge: Continued Medical Work up  Expected Discharge Plan and Services In-house Referral: Clinical Social Work     Living arrangements for the past 2 months: Single Family Home                                       Social Determinants of Health (SDOH) Interventions SDOH Screenings   Food Insecurity: No Food Insecurity (09/11/2023)  Housing: Low Risk  (09/11/2023)  Transportation Needs: No Transportation Needs (09/11/2023)  Utilities: Not At Risk (09/11/2023)  Tobacco Use: Medium Risk (09/11/2023)    Readmission Risk Interventions     No data to display

## 2023-09-13 NOTE — Hospital Course (Signed)
 52yo with h/o ETOH use d/o with recurrent pancreatitis who presented on 3/10 with abdominal pain and n/v.  CT with acute on chronic pancreatitis, conservative management + CIWA protocol.

## 2023-09-13 NOTE — Plan of Care (Signed)
Patient is progressing towards goals.

## 2023-09-14 DIAGNOSIS — K852 Alcohol induced acute pancreatitis without necrosis or infection: Secondary | ICD-10-CM | POA: Diagnosis not present

## 2023-09-14 DIAGNOSIS — K209 Esophagitis, unspecified without bleeding: Secondary | ICD-10-CM | POA: Diagnosis present

## 2023-09-14 DIAGNOSIS — D696 Thrombocytopenia, unspecified: Secondary | ICD-10-CM | POA: Diagnosis present

## 2023-09-14 DIAGNOSIS — F1090 Alcohol use, unspecified, uncomplicated: Secondary | ICD-10-CM | POA: Diagnosis present

## 2023-09-14 DIAGNOSIS — K760 Fatty (change of) liver, not elsewhere classified: Secondary | ICD-10-CM | POA: Diagnosis present

## 2023-09-14 LAB — CBC WITH DIFFERENTIAL/PLATELET
Abs Immature Granulocytes: 0 10*3/uL (ref 0.00–0.07)
Basophils Absolute: 0 10*3/uL (ref 0.0–0.1)
Basophils Relative: 0 %
Eosinophils Absolute: 0 10*3/uL (ref 0.0–0.5)
Eosinophils Relative: 0 %
HCT: 33.6 % — ABNORMAL LOW (ref 36.0–46.0)
Hemoglobin: 11.8 g/dL — ABNORMAL LOW (ref 12.0–15.0)
Lymphocytes Relative: 4 %
Lymphs Abs: 0.5 10*3/uL — ABNORMAL LOW (ref 0.7–4.0)
MCH: 35.6 pg — ABNORMAL HIGH (ref 26.0–34.0)
MCHC: 35.1 g/dL (ref 30.0–36.0)
MCV: 101.5 fL — ABNORMAL HIGH (ref 80.0–100.0)
Monocytes Absolute: 0.6 10*3/uL (ref 0.1–1.0)
Monocytes Relative: 5 %
Neutro Abs: 10.8 10*3/uL — ABNORMAL HIGH (ref 1.7–7.7)
Neutrophils Relative %: 91 %
Platelets: 109 10*3/uL — ABNORMAL LOW (ref 150–400)
RBC: 3.31 MIL/uL — ABNORMAL LOW (ref 3.87–5.11)
RDW: 13 % (ref 11.5–15.5)
WBC: 11.9 10*3/uL — ABNORMAL HIGH (ref 4.0–10.5)
nRBC: 0 /100{WBCs}
nRBC: 0.2 % (ref 0.0–0.2)

## 2023-09-14 LAB — BASIC METABOLIC PANEL
Anion gap: 5 (ref 5–15)
BUN: 5 mg/dL — ABNORMAL LOW (ref 6–20)
CO2: 28 mmol/L (ref 22–32)
Calcium: 8.4 mg/dL — ABNORMAL LOW (ref 8.9–10.3)
Chloride: 101 mmol/L (ref 98–111)
Creatinine, Ser: 0.64 mg/dL (ref 0.44–1.00)
GFR, Estimated: >60 mL/min (ref >60)
Glucose, Bld: 136 mg/dL — ABNORMAL HIGH (ref 70–99)
Potassium: 3.6 mmol/L (ref 3.5–5.1)
Sodium: 134 mmol/L — ABNORMAL LOW (ref 135–145)

## 2023-09-14 LAB — MAGNESIUM: Magnesium: 2 mg/dL (ref 1.7–2.4)

## 2023-09-14 LAB — PHOSPHORUS: Phosphorus: 2 mg/dL — ABNORMAL LOW (ref 2.5–4.6)

## 2023-09-14 MED ORDER — POTASSIUM CHLORIDE CRYS ER 20 MEQ PO TBCR: 40.0000 meq | EXTENDED_RELEASE_TABLET | Freq: Every day | ORAL | 0 refills | Status: AC

## 2023-09-14 MED ORDER — VITAMIN B-1 100 MG PO TABS: 100.0000 mg | ORAL_TABLET | Freq: Every day | ORAL | 0 refills | Status: AC

## 2023-09-14 MED ORDER — FOLIC ACID 1 MG PO TABS: 1.0000 mg | ORAL_TABLET | Freq: Every day | ORAL | 0 refills | Status: AC

## 2023-09-14 MED ORDER — K PHOS MONO-SOD PHOS DI & MONO 155-852-130 MG PO TABS
500.0000 mg | ORAL_TABLET | Freq: Once | ORAL | Status: AC
  Administered 2023-09-14: 500 mg via ORAL
  Filled 2023-09-14: qty 2

## 2023-09-14 MED ORDER — PANTOPRAZOLE SODIUM 40 MG PO TBEC: 40.0000 mg | DELAYED_RELEASE_TABLET | Freq: Every day | ORAL | Status: DC

## 2023-09-14 MED ORDER — PANTOPRAZOLE SODIUM 40 MG PO TBEC: 40.0000 mg | DELAYED_RELEASE_TABLET | Freq: Every day | ORAL | 0 refills | Status: AC

## 2023-09-14 NOTE — Discharge Summary (Signed)
 Physician Discharge Summary   Patient: Kimberly [name] MRN: [id number] DOB: [birth year]  Admit date:     09/11/2023  Discharge date: 09/14/23  Discharge Physician: Jonah Blue   PCP: Patient, No Pcp Per   Recommendations at discharge:   Do NOT drink alcohol - you are "allergic"! Follow up with PCP (referral made) Take Protonix daily Take folate, thiamine, and multivitamin daily Take potassium daily until hospital follow up   Discharge Diagnoses: Principal Problem:   Acute alcoholic pancreatitis Active Problems:   Alcohol use disorder   Hepatic steatosis   Esophagitis   Thrombocytopenia [facility])    Hospital Course: 53yo with h/o ETOH use d/o with recurrent pancreatitis who presented on 3/10 with abdominal pain and n/v.  CT with acute on chronic pancreatitis, conservative management + CIWA protocol.   Assessment and Plan:  Acute on chronic alcoholic pancreatitis Presented with abdominal pain, nausea, vomiting after binge drinking last weekend Lipase level elevated, CT with acute on chronic pancreatic inflammation (also mentions small hemorrhagic fluid within the pancreatic head) Reports controlled pain, developing hunger and wants to advance diet Tolerated CLD for lunch, full liquids for dinner yesterday, soft diet for breakfast today Requests dc to home today   Chronic alcohol use Reports only drinking in the weekend - unclear reliability CIWA protocol - low scores Counseled to quit alcohol   Hepatic steatosis  Elevated LFTs and bilirubin, resolved Likely alcoholic hepatitis + fatty liver Needs to stop drinking   Hypokalemia/hypophosphatemia Replacement given   Esophagitis Mild esophageal wall thickening noted CT scan - likely due to alcohol use PPI  Thrombocytopenia Likely related to bone marrow suppression in the setting of ETOH  Stable at this time        Consultants: TOC team   Procedures: None   Antibiotics: None    Pain control - [place] Controlled Substance Reporting System database was reviewed. and patient was instructed, not to drive, operate heavy machinery, perform activities at heights, swimming or participation in water activities or provide baby-sitting services while on Pain, Sleep and Anxiety Medications; until their outpatient Physician has advised to do so again. Also recommended to not to take more than prescribed Pain, Sleep and Anxiety Medications.    Disposition: Home Diet recommendation:  Regular diet DISCHARGE MEDICATION: Allergies as of 09/14/2023   No Known Allergies      Medication List     STOP taking these medications    dutasteride 0.5 MG capsule Commonly known as: AVODART       TAKE these medications    acetaminophen 500 MG tablet Commonly known as: TYLENOL Take 2 tablets (1,000 mg total) by mouth every 6 (six) hours as needed.   albuterol 108 (90 Base) MCG/ACT inhaler Commonly known as: VENTOLIN HFA Inhale 2 puffs into the lungs every 6 (six) hours as needed for wheezing or shortness of breath.   calcium-vitamin D 500-5 MG-MCG tablet Commonly known as: OSCAL WITH D Take 1 tablet by mouth daily.   EPINEPHrine 0.15 MG/0.3ML injection Commonly known as: EPIPEN JR SMARTSIG:1 pre-filled pen syringe IM PRN   fluticasone 0.005 % ointment Commonly known as: CUTIVATE Apply 1 application  topically daily as needed (eczema).   folic acid 1 MG tablet Commonly known as: FOLVITE Take 1 tablet (1 mg total) by mouth daily. Start taking on: September 15, 2023   ibuprofen 200 MG tablet Commonly known as: ADVIL Take 200 mg by mouth every 6 (six) hours as needed for headache, mild pain (pain score 1-3)  or cramping.   Multivitamin Womens 50+ Adv Tabs Take 1 tablet by mouth daily.   pantoprazole 40 MG tablet Commonly known as: PROTONIX Take 1 tablet (40 mg total) by mouth daily before breakfast. Start taking on: September 15, 2023   potassium chloride SA 20 MEQ tablet Commonly known  as: KLOR-CON M Take 2 tablets (40 mEq total) by mouth daily.   thiamine 100 MG tablet Commonly known as: Vitamin B-1 Take 1 tablet (100 mg total) by mouth daily. Start taking on: September 15, 2023        Discharge Exam:   Subjective: reports that last night's fever was immediately after getting out of the hot shower.  She requests to go home and feels ready.  Tolerating food, minimal pain.   Objective: Vitals:   09/14/23 0413 09/14/23 0756  BP: (!) 154/91 (!) 145/87  Pulse: 88 83  Resp: 17   Temp: 98.7 F (37.1 C) 99.2 F (37.3 C)  SpO2: 96% 94%    Intake/Output Summary (Last 24 hours) at 09/14/2023 1250 Last data filed at 09/13/2023 2121 Gross per 24 hour  Intake 480.81 ml  Output --  Net 480.81 ml   Filed Weights   09/11/23 1038  Weight: 76.7 kg    Exam:  General:  Appears calm and comfortable and is in NAD Eyes:  EOMI, normal lids, iris ENT:  grossly normal hearing, lips & tongue, mmm Neck:  no LAD, masses or thyromegaly Cardiovascular:  RRR, no m/r/g. No LE edema.  Respiratory:   CTA bilaterally with no wheezes/rales/rhonchi.  Normal respiratory effort. Abdomen:  soft, mildly diffusely tender, ND Skin:  no rash or induration seen on limited exam Musculoskeletal:  grossly normal tone BUE/BLE, good ROM, no bony abnormality Psychiatric:  blunted mood and affect, speech fluent and appropriate, AOx3 Neurologic:  CN 2-12 grossly intact, moves all extremities in coordinated fashion  Data Reviewed: I have reviewed the patient's lab results since admission.  Pertinent labs for today include:   Glucose 136 Phos 2.0 WBC 11.9, improved Hgb 11.8 Platelets 109, stable    Condition at discharge: good  The results of significant diagnostics from this hospitalization (including imaging, microbiology, ancillary and laboratory) are listed below for reference.   Imaging Studies: DG CHEST PORT 1 VIEW Result Date: 09/12/2023 CLINICAL DATA:  Hypoxia EXAM: PORTABLE  CHEST 1 VIEW COMPARISON:  12/18/2022 FINDINGS: Subsegmental atelectasis or scarring at the bases. Low lung volumes. Normal cardiac size. No pleural effusion or pneumothorax IMPRESSION: Low lung volumes with subsegmental atelectasis or scarring at the bases. Electronically Signed   By: Jasmine Pang M.D.   On: 09/12/2023 00:08   CT ABDOMEN PELVIS W CONTRAST Result Date: 09/11/2023 CLINICAL DATA:  Nonlocalized abdomen pain EXAM: CT ABDOMEN AND PELVIS WITH CONTRAST TECHNIQUE: Multidetector CT imaging of the abdomen and pelvis was performed using the standard protocol following bolus administration of intravenous contrast. RADIATION DOSE REDUCTION: This exam was performed according to the departmental dose-optimization program which includes automated exposure control, adjustment of the mA and/or kV according to patient size and/or use of iterative reconstruction technique. CONTRAST:  75mL OMNIPAQUE IOHEXOL 350 MG/ML SOLN COMPARISON:  MRI 12/16/2022 FINDINGS: Lower chest: Trace right-sided pleural effusion. Dependent atelectasis at the bases. Mild circumferential distal esophageal thickening Hepatobiliary: Hepatic steatosis. Cholecystectomy. No biliary dilatation Pancreas: Heterogenous enlargement of the pancreatic head and uncinate process with considerable inflammatory change and small volume fluid. Multiple calcifications in the region. Possible small volume hyperdense fluid between the pancreas and second portion of  duodenum, series 3, image 39. Spleen: Normal in size without focal abnormality. Adrenals/Urinary Tract: Adrenal glands are unremarkable. Kidneys are normal, without renal calculi, focal lesion, or hydronephrosis. Bladder is unremarkable. Stomach/Bowel: Stomach is nonenlarged. Thickened appearance of second and third portion of duodenum. Fluid in the colon. Negative appendix Vascular/Lymphatic: No significant vascular findings are present. No enlarged abdominal or pelvic lymph nodes. Reproductive:  Status post hysterectomy. No adnexal masses. Other: No free air. Small free fluid in the pelvis and upper abdomen Musculoskeletal: No acute osseous abnormality IMPRESSION: 1. Findings consistent with acute on chronic pancreatitis involving the pancreatic head and uncinate process. There is considerable inflammatory change and small volume fluid in the region. No organized fluid collections on this exam. Possible small volume hyperdense/hemorrhagic fluid between the pancreatic head and second portion of duodenum. 2. Thickened appearance of second and third portion of duodenum, likely reactive to the adjacent pancreatitis. 3. Hepatic steatosis. 4. Trace right-sided pleural effusion with dependent atelectasis at the bases. 5. Mild circumferential distal esophageal thickening, question esophagitis. Electronically Signed   By: Jasmine Pang M.D.   On: 09/11/2023 21:01    Microbiology: Results for orders placed or performed during the hospital encounter of 09/11/23  Blood culture (routine x 2)     Status: None (Preliminary result)   Collection Time: 09/11/23  5:45 PM   Specimen: BLOOD  Result Value Ref Range Status   Specimen Description BLOOD SITE NOT SPECIFIED  Final   Special Requests   Final    BOTTLES DRAWN AEROBIC AND ANAEROBIC Blood Culture results may not be optimal due to an inadequate volume of blood received in culture bottles   Culture   Final    NO GROWTH 3 DAYS Performed at [facility], [address], [place], [place] [address]    Report Status PENDING  Incomplete  Blood culture (routine x 2)     Status: None (Preliminary result)   Collection Time: 09/11/23  7:25 PM   Specimen: BLOOD RIGHT ARM  Result Value Ref Range Status   Specimen Description BLOOD RIGHT ARM  Final   Special Requests   Final    BOTTLES DRAWN AEROBIC AND ANAEROBIC Blood Culture adequate volume   Culture   Final    NO GROWTH 3 DAYS Performed at [facility], [address] [place], [place]  [address]    Report Status PENDING  Incomplete    Labs: CBC: Recent Labs  Lab 09/11/23 1044 09/12/23 0314 09/13/23 0257 09/14/23 0535  WBC 23.9* 18.0* 13.4* 11.9*  NEUTROABS  --  15.5* 11.1* 10.8*  HGB 15.6* 13.8 12.0 11.8*  HCT 45.2 41.9 35.3* 33.6*  MCV 104.1* 106.1* 105.1* 101.5*  PLT 148* 122* 93* 109*   Basic Metabolic Panel: Recent Labs  Lab 09/11/23 1044 09/12/23 0314 09/13/23 0257 09/14/23 0535  NA 138 139 136 134*  K 3.0* 2.9* 3.2* 3.6  CL 97* 104 105 101  CO2 22 24 23 28   GLUCOSE 163* 130* 104* 136*  BUN 11 11 8  5*  CREATININE 0.76 0.64 0.66 0.64  CALCIUM 9.5 8.6* 8.3* 8.4*  MG  --  2.0  --  2.0  PHOS  --   --  2.2* 2.0*   Liver Function Tests: Recent Labs  Lab 09/11/23 1044 09/12/23 0314 09/13/23 0257  AST 53* 90* 32  ALT 51* 70* 44  ALKPHOS 55 73 54  BILITOT 2.4* 1.5* 1.3*  PROT 7.0 6.1* 5.2*  ALBUMIN 3.6 3.0* 2.5*  CBG: No results for input(s): "GLUCAP" in the last 168 hours.  Discharge time spent: greater than 30 minutes.  Signed: Jonah Blue, MD [facility] 09/14/2023

## 2023-09-14 NOTE — Plan of Care (Signed)

## 2023-09-16 LAB — CULTURE, BLOOD (ROUTINE X 2)
Culture: NO GROWTH
Culture: NO GROWTH
Special Requests: ADEQUATE
